# Patient Record
Sex: Male | Born: 1965 | Race: White | Hispanic: No | Marital: Married | Smoking: Former smoker
Health system: Southern US, Community
[De-identification: ages and names within clinical notes are randomized; demographics above are authoritative.]

## PROBLEM LIST (undated history)

## (undated) DIAGNOSIS — F329 Major depressive disorder, single episode, unspecified: Secondary | ICD-10-CM

## (undated) DIAGNOSIS — R109 Unspecified abdominal pain: Secondary | ICD-10-CM

## (undated) DIAGNOSIS — M1990 Unspecified osteoarthritis, unspecified site: Secondary | ICD-10-CM

## (undated) DIAGNOSIS — F4310 Post-traumatic stress disorder, unspecified: Secondary | ICD-10-CM

## (undated) DIAGNOSIS — R000 Tachycardia, unspecified: Secondary | ICD-10-CM

## (undated) DIAGNOSIS — K219 Gastro-esophageal reflux disease without esophagitis: Secondary | ICD-10-CM

## (undated) DIAGNOSIS — F32A Depression, unspecified: Secondary | ICD-10-CM

## (undated) DIAGNOSIS — G4700 Insomnia, unspecified: Secondary | ICD-10-CM

## (undated) DIAGNOSIS — IMO0001 Reserved for inherently not codable concepts without codable children: Secondary | ICD-10-CM

## (undated) DIAGNOSIS — G4730 Sleep apnea, unspecified: Secondary | ICD-10-CM

## (undated) DIAGNOSIS — G8929 Other chronic pain: Secondary | ICD-10-CM

## (undated) DIAGNOSIS — M542 Cervicalgia: Secondary | ICD-10-CM

## (undated) DIAGNOSIS — F419 Anxiety disorder, unspecified: Secondary | ICD-10-CM

## (undated) DIAGNOSIS — I6529 Occlusion and stenosis of unspecified carotid artery: Secondary | ICD-10-CM

## (undated) DIAGNOSIS — F17200 Nicotine dependence, unspecified, uncomplicated: Secondary | ICD-10-CM

## (undated) HISTORY — PX: COLONOSCOPY: SHX174

## (undated) HISTORY — PX: CHOLECYSTECTOMY: SHX55

---

## 2005-10-15 ENCOUNTER — Emergency Department (HOSPITAL_COMMUNITY): Admission: EM | Admit: 2005-10-15 | Discharge: 2005-10-15 | Payer: Self-pay | Admitting: Emergency Medicine

## 2005-10-15 ENCOUNTER — Inpatient Hospital Stay (HOSPITAL_COMMUNITY): Admission: RE | Admit: 2005-10-15 | Discharge: 2005-10-20 | Payer: Self-pay | Admitting: Psychiatry

## 2005-10-16 ENCOUNTER — Ambulatory Visit: Payer: Self-pay | Admitting: Psychiatry

## 2006-04-08 ENCOUNTER — Emergency Department (HOSPITAL_COMMUNITY): Admission: EM | Admit: 2006-04-08 | Discharge: 2006-04-09 | Payer: Self-pay | Admitting: Emergency Medicine

## 2006-04-09 ENCOUNTER — Inpatient Hospital Stay (HOSPITAL_COMMUNITY): Admission: AD | Admit: 2006-04-09 | Discharge: 2006-04-11 | Payer: Self-pay | Admitting: *Deleted

## 2006-04-09 ENCOUNTER — Ambulatory Visit: Payer: Self-pay | Admitting: *Deleted

## 2007-01-16 ENCOUNTER — Emergency Department (HOSPITAL_COMMUNITY): Admission: EM | Admit: 2007-01-16 | Discharge: 2007-01-16 | Payer: Self-pay | Admitting: Emergency Medicine

## 2007-01-17 ENCOUNTER — Ambulatory Visit: Payer: Self-pay | Admitting: *Deleted

## 2007-01-17 ENCOUNTER — Inpatient Hospital Stay (HOSPITAL_COMMUNITY): Admission: AD | Admit: 2007-01-17 | Discharge: 2007-01-19 | Payer: Self-pay | Admitting: *Deleted

## 2007-10-14 ENCOUNTER — Inpatient Hospital Stay (HOSPITAL_COMMUNITY): Admission: AD | Admit: 2007-10-14 | Discharge: 2007-10-16 | Payer: Self-pay | Admitting: Psychiatry

## 2007-10-14 ENCOUNTER — Emergency Department (HOSPITAL_COMMUNITY): Admission: EM | Admit: 2007-10-14 | Discharge: 2007-10-14 | Payer: Self-pay | Admitting: Emergency Medicine

## 2007-10-14 ENCOUNTER — Ambulatory Visit: Payer: Self-pay | Admitting: Psychiatry

## 2008-10-04 ENCOUNTER — Other Ambulatory Visit: Payer: Self-pay | Admitting: Emergency Medicine

## 2008-10-04 ENCOUNTER — Inpatient Hospital Stay (HOSPITAL_COMMUNITY): Admission: RE | Admit: 2008-10-04 | Discharge: 2008-10-07 | Payer: Self-pay | Admitting: Psychiatry

## 2008-10-04 ENCOUNTER — Ambulatory Visit: Payer: Self-pay | Admitting: Psychiatry

## 2009-01-16 ENCOUNTER — Other Ambulatory Visit: Payer: Self-pay | Admitting: Emergency Medicine

## 2009-01-16 ENCOUNTER — Inpatient Hospital Stay (HOSPITAL_COMMUNITY): Admission: AD | Admit: 2009-01-16 | Discharge: 2009-01-19 | Payer: Self-pay | Admitting: Psychiatry

## 2009-01-16 ENCOUNTER — Ambulatory Visit: Payer: Self-pay | Admitting: Psychiatry

## 2009-02-14 ENCOUNTER — Other Ambulatory Visit: Payer: Self-pay

## 2009-02-14 ENCOUNTER — Other Ambulatory Visit: Payer: Self-pay | Admitting: Emergency Medicine

## 2009-02-14 ENCOUNTER — Ambulatory Visit: Payer: Self-pay | Admitting: Psychiatry

## 2009-02-14 ENCOUNTER — Inpatient Hospital Stay (HOSPITAL_COMMUNITY): Admission: AD | Admit: 2009-02-14 | Discharge: 2009-02-16 | Payer: Self-pay | Admitting: Psychiatry

## 2009-04-22 ENCOUNTER — Emergency Department (HOSPITAL_COMMUNITY): Admission: EM | Admit: 2009-04-22 | Discharge: 2009-04-22 | Payer: Self-pay | Admitting: Emergency Medicine

## 2009-04-23 ENCOUNTER — Emergency Department (HOSPITAL_BASED_OUTPATIENT_CLINIC_OR_DEPARTMENT_OTHER): Admission: EM | Admit: 2009-04-23 | Discharge: 2009-04-23 | Payer: Self-pay | Admitting: Emergency Medicine

## 2009-04-25 ENCOUNTER — Emergency Department (HOSPITAL_COMMUNITY): Admission: EM | Admit: 2009-04-25 | Discharge: 2009-04-25 | Payer: Self-pay | Admitting: Emergency Medicine

## 2009-04-29 ENCOUNTER — Emergency Department (HOSPITAL_BASED_OUTPATIENT_CLINIC_OR_DEPARTMENT_OTHER): Admission: EM | Admit: 2009-04-29 | Discharge: 2009-04-29 | Payer: Self-pay | Admitting: Emergency Medicine

## 2009-07-18 ENCOUNTER — Emergency Department (HOSPITAL_COMMUNITY): Admission: EM | Admit: 2009-07-18 | Discharge: 2009-07-18 | Payer: Self-pay | Admitting: Emergency Medicine

## 2009-08-05 ENCOUNTER — Ambulatory Visit: Payer: Self-pay | Admitting: Diagnostic Radiology

## 2009-08-05 ENCOUNTER — Emergency Department (HOSPITAL_BASED_OUTPATIENT_CLINIC_OR_DEPARTMENT_OTHER): Admission: EM | Admit: 2009-08-05 | Discharge: 2009-08-05 | Payer: Self-pay | Admitting: Emergency Medicine

## 2009-08-06 ENCOUNTER — Other Ambulatory Visit: Payer: Self-pay

## 2009-08-06 ENCOUNTER — Other Ambulatory Visit: Payer: Self-pay | Admitting: Emergency Medicine

## 2009-08-06 ENCOUNTER — Inpatient Hospital Stay (HOSPITAL_COMMUNITY): Admission: AD | Admit: 2009-08-06 | Discharge: 2009-08-08 | Payer: Self-pay | Admitting: Psychiatry

## 2009-08-06 ENCOUNTER — Ambulatory Visit: Payer: Self-pay | Admitting: Psychiatry

## 2009-08-25 ENCOUNTER — Emergency Department (HOSPITAL_BASED_OUTPATIENT_CLINIC_OR_DEPARTMENT_OTHER): Admission: EM | Admit: 2009-08-25 | Discharge: 2009-08-25 | Payer: Self-pay | Admitting: Emergency Medicine

## 2009-10-28 ENCOUNTER — Emergency Department (HOSPITAL_BASED_OUTPATIENT_CLINIC_OR_DEPARTMENT_OTHER): Admission: EM | Admit: 2009-10-28 | Discharge: 2009-10-28 | Payer: Self-pay | Admitting: Emergency Medicine

## 2010-01-13 ENCOUNTER — Emergency Department (HOSPITAL_BASED_OUTPATIENT_CLINIC_OR_DEPARTMENT_OTHER): Admission: EM | Admit: 2010-01-13 | Discharge: 2010-01-13 | Payer: Self-pay | Admitting: Emergency Medicine

## 2010-01-13 ENCOUNTER — Ambulatory Visit: Payer: Self-pay | Admitting: Diagnostic Radiology

## 2010-02-16 ENCOUNTER — Emergency Department (HOSPITAL_BASED_OUTPATIENT_CLINIC_OR_DEPARTMENT_OTHER): Admission: EM | Admit: 2010-02-16 | Discharge: 2010-02-16 | Payer: Self-pay | Admitting: Emergency Medicine

## 2010-02-16 ENCOUNTER — Ambulatory Visit: Payer: Self-pay | Admitting: Diagnostic Radiology

## 2010-03-19 ENCOUNTER — Emergency Department (HOSPITAL_COMMUNITY)
Admission: EM | Admit: 2010-03-19 | Discharge: 2010-03-20 | Disposition: A | Discharge status: Other Acute Inpatient Hospital | Payer: Self-pay | Source: Home / Self Care | Admitting: Emergency Medicine

## 2010-03-19 ENCOUNTER — Emergency Department (HOSPITAL_BASED_OUTPATIENT_CLINIC_OR_DEPARTMENT_OTHER)
Admission: EM | Admit: 2010-03-19 | Discharge: 2010-03-19 | Payer: Self-pay | Source: Home / Self Care | Admitting: Emergency Medicine

## 2010-03-20 ENCOUNTER — Inpatient Hospital Stay (HOSPITAL_COMMUNITY)
Admission: RE | Admit: 2010-03-20 | Discharge: 2010-03-22 | Payer: Self-pay | Source: Home / Self Care | Attending: Psychiatry | Admitting: Psychiatry

## 2010-05-05 ENCOUNTER — Emergency Department (HOSPITAL_BASED_OUTPATIENT_CLINIC_OR_DEPARTMENT_OTHER)
Admission: EM | Admit: 2010-05-05 | Discharge: 2010-05-05 | Disposition: A | Discharge status: Home / Self Care | Payer: Medicare Other | Attending: Emergency Medicine | Admitting: Emergency Medicine

## 2010-05-05 DIAGNOSIS — G8929 Other chronic pain: Secondary | ICD-10-CM | POA: Insufficient documentation

## 2010-05-05 DIAGNOSIS — M62838 Other muscle spasm: Secondary | ICD-10-CM | POA: Insufficient documentation

## 2010-05-05 DIAGNOSIS — M542 Cervicalgia: Secondary | ICD-10-CM | POA: Insufficient documentation

## 2010-05-05 DIAGNOSIS — F319 Bipolar disorder, unspecified: Secondary | ICD-10-CM | POA: Insufficient documentation

## 2010-05-24 ENCOUNTER — Emergency Department (HOSPITAL_BASED_OUTPATIENT_CLINIC_OR_DEPARTMENT_OTHER)
Admission: EM | Admit: 2010-05-24 | Discharge: 2010-05-24 | Disposition: A | Discharge status: Home / Self Care | Payer: Medicare Other | Attending: Emergency Medicine | Admitting: Emergency Medicine

## 2010-05-24 DIAGNOSIS — Z79899 Other long term (current) drug therapy: Secondary | ICD-10-CM | POA: Insufficient documentation

## 2010-05-24 DIAGNOSIS — F17200 Nicotine dependence, unspecified, uncomplicated: Secondary | ICD-10-CM | POA: Insufficient documentation

## 2010-05-24 DIAGNOSIS — M542 Cervicalgia: Secondary | ICD-10-CM | POA: Insufficient documentation

## 2010-05-24 DIAGNOSIS — R51 Headache: Secondary | ICD-10-CM | POA: Insufficient documentation

## 2010-05-24 DIAGNOSIS — F3130 Bipolar disorder, current episode depressed, mild or moderate severity, unspecified: Secondary | ICD-10-CM | POA: Insufficient documentation

## 2010-05-24 DIAGNOSIS — F411 Generalized anxiety disorder: Secondary | ICD-10-CM | POA: Insufficient documentation

## 2010-06-08 ENCOUNTER — Emergency Department (HOSPITAL_BASED_OUTPATIENT_CLINIC_OR_DEPARTMENT_OTHER)
Admission: EM | Admit: 2010-06-08 | Discharge: 2010-06-08 | Disposition: A | Discharge status: Home / Self Care | Payer: Medicare Other | Attending: Emergency Medicine | Admitting: Emergency Medicine

## 2010-06-08 DIAGNOSIS — G8929 Other chronic pain: Secondary | ICD-10-CM | POA: Insufficient documentation

## 2010-06-08 DIAGNOSIS — M542 Cervicalgia: Secondary | ICD-10-CM | POA: Insufficient documentation

## 2010-06-08 DIAGNOSIS — Z79899 Other long term (current) drug therapy: Secondary | ICD-10-CM | POA: Insufficient documentation

## 2010-06-08 DIAGNOSIS — F319 Bipolar disorder, unspecified: Secondary | ICD-10-CM | POA: Insufficient documentation

## 2010-06-09 ENCOUNTER — Emergency Department (HOSPITAL_BASED_OUTPATIENT_CLINIC_OR_DEPARTMENT_OTHER)
Admission: EM | Admit: 2010-06-09 | Discharge: 2010-06-09 | Discharge status: Against Medical Advice | Payer: Medicare Other | Attending: Emergency Medicine | Admitting: Emergency Medicine

## 2010-06-09 ENCOUNTER — Ambulatory Visit (HOSPITAL_BASED_OUTPATIENT_CLINIC_OR_DEPARTMENT_OTHER): Payer: No Typology Code available for payment source

## 2010-06-09 ENCOUNTER — Emergency Department (HOSPITAL_BASED_OUTPATIENT_CLINIC_OR_DEPARTMENT_OTHER): Payer: Medicare Other

## 2010-06-09 DIAGNOSIS — Z79899 Other long term (current) drug therapy: Secondary | ICD-10-CM | POA: Insufficient documentation

## 2010-06-09 DIAGNOSIS — G8929 Other chronic pain: Secondary | ICD-10-CM | POA: Insufficient documentation

## 2010-06-09 DIAGNOSIS — F319 Bipolar disorder, unspecified: Secondary | ICD-10-CM | POA: Insufficient documentation

## 2010-06-09 DIAGNOSIS — Y92410 Unspecified street and highway as the place of occurrence of the external cause: Secondary | ICD-10-CM | POA: Insufficient documentation

## 2010-06-09 DIAGNOSIS — M542 Cervicalgia: Secondary | ICD-10-CM | POA: Insufficient documentation

## 2010-06-10 LAB — DIFFERENTIAL
Basophils Absolute: 0 K/uL (ref 0.0–0.1)
Basophils Relative: 0 % (ref 0–1)
Eosinophils Absolute: 0.1 10*3/uL (ref 0.0–0.7)
Eosinophils Relative: 2 % (ref 0–5)
Lymphocytes Relative: 40 % (ref 12–46)
Lymphs Abs: 2.4 10*3/uL (ref 0.7–4.0)
Monocytes Absolute: 0.6 10*3/uL (ref 0.1–1.0)
Monocytes Relative: 10 % (ref 3–12)
Neutro Abs: 2.8 K/uL (ref 1.7–7.7)
Neutrophils Relative %: 47 % (ref 43–77)

## 2010-06-10 LAB — CBC
HCT: 44.3 % (ref 39.0–52.0)
Hemoglobin: 15.7 g/dL (ref 13.0–17.0)
MCH: 34.4 pg — ABNORMAL HIGH (ref 26.0–34.0)
MCHC: 35.4 g/dL (ref 30.0–36.0)
MCV: 97.1 fL (ref 78.0–100.0)
Platelets: 246 K/uL (ref 150–400)
RBC: 4.56 MIL/uL (ref 4.22–5.81)
RDW: 13.6 % (ref 11.5–15.5)
WBC: 6 K/uL (ref 4.0–10.5)

## 2010-06-10 LAB — URINALYSIS, ROUTINE W REFLEX MICROSCOPIC
Glucose, UA: NEGATIVE mg/dL
Ketones, ur: NEGATIVE mg/dL
Leukocytes, UA: NEGATIVE
Nitrite: NEGATIVE
Protein, ur: NEGATIVE mg/dL
Specific Gravity, Urine: 1.036 — ABNORMAL HIGH (ref 1.005–1.030)
Urobilinogen, UA: 0.2 mg/dL (ref 0.0–1.0)
pH: 5.5 (ref 5.0–8.0)

## 2010-06-10 LAB — URINE MICROSCOPIC-ADD ON

## 2010-06-10 LAB — BASIC METABOLIC PANEL
CO2: 24 mEq/L (ref 19–32)
Calcium: 9.2 mg/dL (ref 8.4–10.5)
Chloride: 105 mEq/L (ref 96–112)
GFR calc Af Amer: >60 mL/min (ref >60)
Glucose, Bld: 123 mg/dL — ABNORMAL HIGH (ref 70–99)
Sodium: 138 mEq/L (ref 135–145)

## 2010-06-10 LAB — RAPID URINE DRUG SCREEN, HOSP PERFORMED
Amphetamines: NOT DETECTED
Barbiturates: NOT DETECTED
Benzodiazepines: POSITIVE — AB
Cocaine: NOT DETECTED
Opiates: POSITIVE — AB
Tetrahydrocannabinol: POSITIVE — AB

## 2010-06-10 LAB — BASIC METABOLIC PANEL WITH GFR
BUN: 11 mg/dL (ref 6–23)
Creatinine, Ser: 1.38 mg/dL (ref 0.4–1.5)
GFR calc non Af Amer: 56 mL/min — ABNORMAL LOW (ref >60)
Potassium: 4.4 meq/L (ref 3.5–5.1)

## 2010-06-10 LAB — ETHANOL: Alcohol, Ethyl (B): <5 mg/dL (ref 0–10)

## 2010-06-15 LAB — URINALYSIS, ROUTINE W REFLEX MICROSCOPIC
Bilirubin Urine: NEGATIVE
Glucose, UA: NEGATIVE mg/dL
Hgb urine dipstick: NEGATIVE
Protein, ur: NEGATIVE mg/dL
Urobilinogen, UA: 0.2 mg/dL (ref 0.0–1.0)

## 2010-06-16 LAB — CBC
HCT: 37.3 % — ABNORMAL LOW (ref 39.0–52.0)
HCT: 43.6 % (ref 39.0–52.0)
Hemoglobin: 13.3 g/dL (ref 13.0–17.0)
MCHC: 34.6 g/dL (ref 30.0–36.0)
MCV: 100 fL (ref 78.0–100.0)
MCV: 100.3 fL — ABNORMAL HIGH (ref 78.0–100.0)
Platelets: 185 10*3/uL (ref 150–400)
Platelets: 205 10*3/uL (ref 150–400)
RDW: 12.3 % (ref 11.5–15.5)
RDW: 12.7 % (ref 11.5–15.5)
WBC: 4.3 10*3/uL (ref 4.0–10.5)

## 2010-06-16 LAB — DIFFERENTIAL
Basophils Absolute: 0 10*3/uL (ref 0.0–0.1)
Eosinophils Absolute: 0.1 10*3/uL (ref 0.0–0.7)
Eosinophils Relative: 3 % (ref 0–5)
Lymphocytes Relative: 32 % (ref 12–46)
Lymphs Abs: 1.7 10*3/uL (ref 0.7–4.0)
Monocytes Absolute: 0.3 10*3/uL (ref 0.1–1.0)
Monocytes Absolute: 0.4 10*3/uL (ref 0.1–1.0)
Monocytes Relative: 7 % (ref 3–12)
Monocytes Relative: 9 % (ref 3–12)
Neutro Abs: 2.7 10*3/uL (ref 1.7–7.7)
Neutrophils Relative %: 55 % (ref 43–77)

## 2010-06-16 LAB — COMPREHENSIVE METABOLIC PANEL
Albumin: 3.6 g/dL (ref 3.5–5.2)
BUN: 11 mg/dL (ref 6–23)
BUN: 11 mg/dL (ref 6–23)
Chloride: 106 mEq/L (ref 96–112)
Creatinine, Ser: 1.03 mg/dL (ref 0.4–1.5)
Creatinine, Ser: 1.03 mg/dL (ref 0.4–1.5)
GFR calc non Af Amer: >60 mL/min (ref >60)
Glucose, Bld: 100 mg/dL — ABNORMAL HIGH (ref 70–99)
Glucose, Bld: 86 mg/dL (ref 70–99)
Potassium: 3.9 mEq/L (ref 3.5–5.1)
Total Bilirubin: 0.5 mg/dL (ref 0.3–1.2)
Total Protein: 6.3 g/dL (ref 6.0–8.3)

## 2010-06-16 LAB — URINALYSIS, ROUTINE W REFLEX MICROSCOPIC
Glucose, UA: NEGATIVE mg/dL
Hgb urine dipstick: NEGATIVE
Ketones, ur: NEGATIVE mg/dL
Protein, ur: NEGATIVE mg/dL
pH: 6 (ref 5.0–8.0)

## 2010-06-17 LAB — COMPREHENSIVE METABOLIC PANEL
ALT: 22 U/L (ref 0–53)
AST: 24 U/L (ref 0–37)
Albumin: 3.9 g/dL (ref 3.5–5.2)
Alkaline Phosphatase: 63 U/L (ref 39–117)
BUN: 12 mg/dL (ref 6–23)
Chloride: 107 mEq/L (ref 96–112)
GFR calc Af Amer: >60 mL/min (ref >60)
Potassium: 4.4 mEq/L (ref 3.5–5.1)
Sodium: 144 mEq/L (ref 135–145)
Total Protein: 7.1 g/dL (ref 6.0–8.3)

## 2010-06-17 LAB — CBC
Platelets: 200 10*3/uL (ref 150–400)
RDW: 13.1 % (ref 11.5–15.5)
WBC: 4.3 10*3/uL (ref 4.0–10.5)

## 2010-06-17 LAB — URINALYSIS, ROUTINE W REFLEX MICROSCOPIC
Ketones, ur: NEGATIVE mg/dL
Nitrite: NEGATIVE
Protein, ur: NEGATIVE mg/dL
Urobilinogen, UA: 0.2 mg/dL (ref 0.0–1.0)

## 2010-06-17 LAB — DIFFERENTIAL
Basophils Relative: 1 % (ref 0–1)
Eosinophils Relative: 2 % (ref 0–5)
Monocytes Absolute: 0.3 10*3/uL (ref 0.1–1.0)
Monocytes Relative: 8 % (ref 3–12)
Neutro Abs: 2.8 10*3/uL (ref 1.7–7.7)

## 2010-06-18 LAB — CBC
HCT: 39.7 % (ref 39.0–52.0)
Hemoglobin: 14.2 g/dL (ref 13.0–17.0)
Hemoglobin: 14.1 g/dL (ref 13.0–17.0)
MCHC: 35.5 g/dL (ref 30.0–36.0)
MCHC: 34.8 g/dL (ref 30.0–36.0)
MCV: 97.8 fL (ref 78.0–100.0)
MCV: 97.2 fL (ref 78.0–100.0)
RBC: 3.95 MIL/uL — ABNORMAL LOW (ref 4.22–5.81)
RBC: 4.21 MIL/uL — ABNORMAL LOW (ref 4.22–5.81)
RDW: 13.7 % (ref 11.5–15.5)
WBC: 4.5 10*3/uL (ref 4.0–10.5)

## 2010-06-18 LAB — DIFFERENTIAL
Basophils Absolute: 0 10*3/uL (ref 0.0–0.1)
Basophils Absolute: 0 10*3/uL (ref 0.0–0.1)
Basophils Relative: 1 % (ref 0–1)
Basophils Relative: 1 % (ref 0–1)
Eosinophils Absolute: 0.1 10*3/uL (ref 0.0–0.7)
Eosinophils Absolute: 0.1 10*3/uL (ref 0.0–0.7)
Lymphocytes Relative: 35 % (ref 12–46)
Lymphs Abs: 1.6 10*3/uL (ref 0.7–4.0)
Lymphs Abs: 2 10*3/uL (ref 0.7–4.0)
Monocytes Absolute: 0.5 10*3/uL (ref 0.1–1.0)
Neutro Abs: 2.1 10*3/uL (ref 1.7–7.7)
Neutrophils Relative %: 53 % (ref 43–77)
Neutrophils Relative %: 56 % (ref 43–77)
Neutrophils Relative %: 46 % (ref 43–77)

## 2010-06-18 LAB — COMPREHENSIVE METABOLIC PANEL
ALT: 133 U/L — ABNORMAL HIGH (ref 0–53)
ALT: 58 U/L — ABNORMAL HIGH (ref 0–53)
AST: 49 U/L — ABNORMAL HIGH (ref 0–37)
Albumin: 3.5 g/dL (ref 3.5–5.2)
Alkaline Phosphatase: 83 U/L (ref 39–117)
BUN: 9 mg/dL (ref 6–23)
CO2: 25 mEq/L (ref 19–32)
CO2: 28 mEq/L (ref 19–32)
Calcium: 8.8 mg/dL (ref 8.4–10.5)
Calcium: 9.3 mg/dL (ref 8.4–10.5)
Chloride: 105 mEq/L (ref 96–112)
Creatinine, Ser: 1.02 mg/dL (ref 0.4–1.5)
GFR calc Af Amer: >60 mL/min (ref >60)
GFR calc non Af Amer: >60 mL/min (ref >60)
GFR calc non Af Amer: >60 mL/min (ref >60)
Glucose, Bld: 94 mg/dL (ref 70–99)
Glucose, Bld: 95 mg/dL (ref 70–99)
Glucose, Bld: 93 mg/dL (ref 70–99)
Potassium: 3.7 mEq/L (ref 3.5–5.1)
Sodium: 144 mEq/L (ref 135–145)
Sodium: 138 mEq/L (ref 135–145)
Total Bilirubin: 0.4 mg/dL (ref 0.3–1.2)
Total Bilirubin: 0.2 mg/dL — ABNORMAL LOW (ref 0.3–1.2)

## 2010-06-18 LAB — URINALYSIS, ROUTINE W REFLEX MICROSCOPIC
Bilirubin Urine: NEGATIVE
Hgb urine dipstick: NEGATIVE
Ketones, ur: NEGATIVE mg/dL
Nitrite: NEGATIVE
Nitrite: NEGATIVE
Protein, ur: NEGATIVE mg/dL
Specific Gravity, Urine: 1.012 (ref 1.005–1.030)
Urobilinogen, UA: 0.2 mg/dL (ref 0.0–1.0)
pH: 5 (ref 5.0–8.0)
pH: 6 (ref 5.0–8.0)

## 2010-06-18 LAB — RAPID URINE DRUG SCREEN, HOSP PERFORMED
Barbiturates: NOT DETECTED
Cocaine: NOT DETECTED
Opiates: POSITIVE — AB

## 2010-06-18 LAB — LIPASE, BLOOD
Lipase: 38 U/L (ref 11–59)
Lipase: 185 U/L (ref 23–300)

## 2010-06-18 LAB — ACETAMINOPHEN LEVEL: Acetaminophen (Tylenol), Serum: <10 ug/mL — ABNORMAL LOW (ref 10–30)

## 2010-06-18 LAB — ETHANOL: Alcohol, Ethyl (B): <5 mg/dL (ref 0–10)

## 2010-07-03 LAB — DIFFERENTIAL
Basophils Absolute: 0.1 10*3/uL (ref 0.0–0.1)
Eosinophils Relative: 2 % (ref 0–5)
Lymphocytes Relative: 32 % (ref 12–46)
Lymphs Abs: 2.3 10*3/uL (ref 0.7–4.0)
Monocytes Absolute: 0.6 10*3/uL (ref 0.1–1.0)
Monocytes Relative: 8 % (ref 3–12)
Neutro Abs: 4 10*3/uL (ref 1.7–7.7)

## 2010-07-03 LAB — RAPID URINE DRUG SCREEN, HOSP PERFORMED
Cocaine: NOT DETECTED
Tetrahydrocannabinol: NOT DETECTED

## 2010-07-03 LAB — BASIC METABOLIC PANEL
BUN: 7 mg/dL (ref 6–23)
CO2: 28 mEq/L (ref 19–32)
Chloride: 104 mEq/L (ref 96–112)
GFR calc non Af Amer: >60 mL/min (ref >60)
Glucose, Bld: 109 mg/dL — ABNORMAL HIGH (ref 70–99)
Potassium: 3.9 mEq/L (ref 3.5–5.1)
Sodium: 137 mEq/L (ref 135–145)

## 2010-07-03 LAB — CBC
HCT: 46.7 % (ref 39.0–52.0)
Hemoglobin: 16.4 g/dL (ref 13.0–17.0)
RBC: 4.58 MIL/uL (ref 4.22–5.81)
RDW: 13.6 % (ref 11.5–15.5)

## 2010-07-03 LAB — TRICYCLICS SCREEN, URINE: TCA Scrn: NOT DETECTED

## 2010-07-04 LAB — CBC
HCT: 41.7 % (ref 39.0–52.0)
Hemoglobin: 14.7 g/dL (ref 13.0–17.0)
MCV: 102.8 fL — ABNORMAL HIGH (ref 78.0–100.0)
Platelets: 209 10*3/uL (ref 150–400)
RDW: 14.7 % (ref 11.5–15.5)

## 2010-07-04 LAB — BASIC METABOLIC PANEL
BUN: 14 mg/dL (ref 6–23)
CO2: 28 mEq/L (ref 19–32)
Chloride: 104 mEq/L (ref 96–112)
GFR calc non Af Amer: 57 mL/min — ABNORMAL LOW (ref >60)
Glucose, Bld: 95 mg/dL (ref 70–99)
Potassium: 4 mEq/L (ref 3.5–5.1)
Sodium: 139 mEq/L (ref 135–145)

## 2010-07-04 LAB — DIFFERENTIAL
Basophils Absolute: 0 10*3/uL (ref 0.0–0.1)
Eosinophils Absolute: 0.1 10*3/uL (ref 0.0–0.7)
Eosinophils Relative: 2 % (ref 0–5)
Lymphocytes Relative: 33 % (ref 12–46)
Lymphs Abs: 1.4 10*3/uL (ref 0.7–4.0)
Monocytes Absolute: 0.4 10*3/uL (ref 0.1–1.0)

## 2010-07-04 LAB — TRICYCLICS SCREEN, URINE: TCA Scrn: NOT DETECTED

## 2010-07-04 LAB — RAPID URINE DRUG SCREEN, HOSP PERFORMED
Amphetamines: NOT DETECTED
Benzodiazepines: POSITIVE — AB

## 2010-07-07 LAB — COMPREHENSIVE METABOLIC PANEL
ALT: 20 U/L (ref 0–53)
AST: 22 U/L (ref 0–37)
Albumin: 3.6 g/dL (ref 3.5–5.2)
Alkaline Phosphatase: 58 U/L (ref 39–117)
Chloride: 109 mEq/L (ref 96–112)
Potassium: 4 mEq/L (ref 3.5–5.1)
Sodium: 142 mEq/L (ref 135–145)
Total Protein: 6.1 g/dL (ref 6.0–8.3)

## 2010-07-07 LAB — ETHANOL: Alcohol, Ethyl (B): <5 mg/dL (ref 0–10)

## 2010-07-07 LAB — DIFFERENTIAL
Basophils Relative: 1 % (ref 0–1)
Eosinophils Absolute: 0.1 10*3/uL (ref 0.0–0.7)
Eosinophils Relative: 1 % (ref 0–5)
Monocytes Relative: 9 % (ref 3–12)
Neutrophils Relative %: 59 % (ref 43–77)

## 2010-07-07 LAB — URINALYSIS, ROUTINE W REFLEX MICROSCOPIC
Glucose, UA: NEGATIVE mg/dL
Hgb urine dipstick: NEGATIVE
Ketones, ur: NEGATIVE mg/dL
Protein, ur: NEGATIVE mg/dL
Urobilinogen, UA: 0.2 mg/dL (ref 0.0–1.0)

## 2010-07-07 LAB — CBC
Platelets: 208 10*3/uL (ref 150–400)
RDW: 14.4 % (ref 11.5–15.5)
WBC: 4.7 10*3/uL (ref 4.0–10.5)

## 2010-07-07 LAB — RAPID URINE DRUG SCREEN, HOSP PERFORMED
Amphetamines: NOT DETECTED
Barbiturates: NOT DETECTED
Benzodiazepines: POSITIVE — AB
Tetrahydrocannabinol: POSITIVE — AB
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
## 2010-08-13 NOTE — H&P (Signed)
NAMEJERIMY, [name]               ACCOUNT NO.:  [id number]   MEDICAL RECORD NO.:  [id number]          PATIENT TYPE:  IPS   LOCATION:  0406                          FACILITY:  [facility]   PHYSICIAN:  Anselm Jungling, MD  DATE OF BIRTH:  Aug 22, 1965   DATE OF ADMISSION:  10/14/2007  DATE OF DISCHARGE:                       PSYCHIATRIC ADMISSION ASSESSMENT   VOLUNTARY ADMISSION:  This is a 45 year old married white male.  He  presented to the emergency department at [facility].  He reported that  he had become suicidal with a plan to kill himself.  This plan was to  use a horse needle to drain his blood.  He reported this plan to his  counselor, Sherron Monday, who advised admission.  The patient reported that  he had been noncompliant with his prescribed meds recently, due to  financial difficulties.  Today, he clarifies this by saying that  actually specifically it was Geodon.  His urine drug screen was positive  for cocaine, marijuana, benzos and amphetamines.  He is prescribed the  benzo.  He had no explanation for the amphetamines.  He states that,  because he started having suicidal ideation, he went and used cocaine.  He states that this is only his second use of cocaine since he was last  treated at [facility] in April.   PAST PSYCHIATRIC HISTORY:  [name] was with [place] last October.  At that  time, he was only positive for marijuana.  He was treated at the [facility] for approximately ten days.  He reports not staying  clean any length of time at all.  He currently sees a therapist, Sherron Monday.   SOCIAL HISTORY:  He is married for 19 years.  He has no children.  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   FAMILY HISTORY:  He states his grandfather used alcohol.  His maternal  uncle was bipolar.  He is since deceased, and his mother has depression.   ALCOHOL AND DRUG HISTORY:  He began using marijuana and alcohol at age  36.  He started  using cocaine at age 49.   His primary care physician is Dr. Kathrynn Speed in [facility].  He has no  psychiatrist and he does see the therapist, Sherron Monday.   MEDICAL PROBLEMS:  None are known.   MEDICATIONS:  1. He is prescribed Paxil 80 mg p.o. daily.  2. Geodon 60 mg p.o. b.i.d.  3. Ambien 10 mg at h.s.  4. Klonopin 1 mg at h.s.  5. Xanax 0.5 mg p.o. b.i.d.  6. Nexium 40 mg p.o. daily.   ALLERGIES:  He states that he has an allergy to ANTIHISTAMINES; they  make his head crawl.   POSITIVE PHYSICAL FINDINGS:  As already stated, his UDS was markedly  positive, cocaine, marijuana, benzos, amphetamines.  His alcohol level  was less than 5.  His glucose level was 118.  He had no other abnormal  lab findings.  He was medically cleared in the ED at [facility] and his  physical exam on arrival in our unit shows that he is 5 feet 8, he  weighs 186, his temperature is 98.6, blood pressure is 108/76 to 107/73,  pulse is 87, respirations are 20.   MENTAL STATUS EXAM:  Today he is alert and oriented.  He is casually  dressed in a T-shirt and jeans.  He is appropriately groomed, having  just showered, and he appears to be adequately nourished.  His speech is  soft and slow.  His mood is better after having gotten some sleep last  night.  His affect looks kind of sleepy.  His thought processes are  clear, rational and goal-oriented.  He states that the Geodon co-pay is  too expensive for him.  Judgment and insight are intact.  Concentration  and memory are superficially intact.  He was not thoroughly tested.  His  intelligence level is average to borderline.  He denies being actively  suicidal.  He was never homicidal and he has never had auditory or  visual hallucinations.   DIAGNOSIS:  AXIS I:  Substance-induced mood disorder, polysubstance  abuse, depressive disorder, NOS.  AXIS II:  Borderline IQ possibility.  AXIS III:  GERD.  AXIS IV:  Financial.  AXIS V:  25.   PLAN:  The plan is to  admit for safety and stabilization.  We will  restart his medications that have been efficacious.  We will have the  pharmacist work with him regarding co-pay and we may have to substitute  a medication, if he really cannot afford this.  Estimated length of stay  is 3-5 days.      Mickie Leonarda Salon, P.A.-C.      Anselm Jungling, MD  Electronically Signed    MD/MEDQ  D:  10/16/2007  T:  10/16/2007  Job:  [phone]
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
## 2010-08-16 NOTE — Discharge Summary (Signed)
NAMECHIEF, WALKUP               ACCOUNT NO.:  [id number]   MEDICAL RECORD NO.:  [id number]          PATIENT TYPE:  IPS   LOCATION:  0303                          FACILITY:  BH   PHYSICIAN:  Jasmine Pang, M.D. DATE OF BIRTH:  05-Aug-1965   DATE OF ADMISSION:  10/04/2008  DATE OF DISCHARGE:  10/07/2008                               DISCHARGE SUMMARY   IDENTIFICATION:  This is a 45 year old married white male from [facility] who was admitted on a voluntary basis.   HISTORY OF PRESENT ILLNESS:  The patient complained of anxiety with  thoughts of hurting himself.  He presented to the ED with complaints of  anxiety.  He reported this started when a friend died 2 days ago.  He  reports he is worried he may harm himself.  He admitted to being very  upset about the recent death of his friend.  He is married and has a  supportive wife.  He is on disability for psychiatric problems.  He is  currently on Geodon and Paxil.  He was here approximately 2 years ago.  He feels his medicines do good.  For further admission information,  see psychiatric admission assessment.   PHYSICAL FINDINGS:  There were no acute physical or medical problems  noted.   HOSPITAL COURSE:  Upon admission, the patient was restarted on his home  medications of Ambien 10 mg p.o. q.h.s. Klonopin 1 mg q.a.m. and h.s.,  Ativan 1 mg daily p.r.n., Paxil 40 mg daily, Geodon 40 mg a.m. and 1700  hours, Nexium 40 mg daily, and albuterol inhaler 90 mcg q.4-6 h. p.r.n.  He was also started on Cogentin 1 mg p.o. b.i.d. to be given with the  Geodon and ibuprofen 600 mg p.o. q.4 h p.r.n. pain.  In individual  sessions, the patient had good eye contact.  Speech was normal rate and  flow.  Psychomotor activity was within normal limits.  Mood was  depressed and anxious.  Affect was consistent with mood.  There was  positive suicidal ideation.  No homicidal ideation.  There was no  evidence of a thought disorder or  psychosis.  On October 06, 2008, the  patient become less depressed and less anxious.  His Ativan was  discontinued.  He was changed to Klonopin 1 mg p.o. b.i.d. or a.m. and 1  p.m. and 1 mg at bedtime.  On October 07, 2008, he stated he felt less  jumpy on Cogentin.  His sleep was good.  Appetite was good.  Mood was  euthymic.  Affect consistent with mood.  There was no suicidal or  homicidal ideation.  No thoughts of self injurious behavior.  No  auditory or visual hallucinations.  No paranoia or delusions.  Thoughts  were logical and goal-directed.  Thought content no predominant theme.  Cognitive was grossly intact.  Insight good.  Judgment good.  Impulse  control good.  It was felt the patient was safe for discharge today.   DISCHARGE DIAGNOSES:  Axis I:  Bipolar disorder, not otherwise  specified.  Axis II:  No diagnosis.  Axis III:  Gastroesophageal reflux disease.  Axis IV:  Severe (grief with death of friend, problems related to social  environment, and burden of psychiatric illness).  Axis V:  Global assessment of functioning was 50 upon discharge.  Global  assessment of functioning was 30 upon admission.  Global assessment of  functioning highest past year was 65.   DISCHARGE PLAN:  There are no specific activity level or dietary  restrictions.   POST HOSPITAL CARE PLANS:  The patient will see his primary care doctor,  Dr. Brock Bad on July 12 at 10 o'clock.  He will also see his counselor,  Sherron Monday for followup therapy.   DISCHARGE MEDICATIONS:  1. Paxil 40 mg daily.  2. Geodon 40 mg twice daily with food.  3. Nexium 40 mg daily.  4. Cogentin 1 mg twice daily.  5. Klonopin 1 mg 3 times daily.  6. Ambien 10 mg at bedtime.      Jasmine Pang, M.D.  Electronically Signed     BHS/MEDQ  D:  10/16/2008  T:  10/17/2008  Job:  [id number]
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
## 2010-08-16 NOTE — Discharge Summary (Signed)
[name], [name]               ACCOUNT NO.:  [id number]   MEDICAL RECORD NO.:  [id number]          PATIENT TYPE:  IPS   LOCATION:  0406                          FACILITY:  [facility]   PHYSICIAN:  Jasmine Pang, M.D. DATE OF BIRTH:  29-Nov-1965   DATE OF ADMISSION:  04/09/2006  DATE OF DISCHARGE:  04/11/2006                               DISCHARGE SUMMARY   IDENTIFICATION:  A  45 year old married white male who was admitted on a  voluntary basis on April 09, 2006.  The patient had a history of  depression with suicidal ideation, stating he had a plan to get the  blood out.  He has been off his meds for one week.  He states he could  not afford them.  He is unemployed.  He has been unemployed for some  time.  He has been getting nervous, knowing disability is coming.  He  denies any voices.  He states he rushed to his therapist, who felt he  should come into the hospital.  This is the second psych admission for  this patient.  He was admitted in the 1990s at some point.  His  therapist as [name].  He is currently on Paxil 80 mg daily, Geodon 400  mg daily, Xanax 0.5 mg b.i.d. p.r.n., Klonopin 1 mg q.h.s., Ambien 10 mg  q.h.s. p.r.n., Trazodone 300 mg q.h.s.   PHYSICAL EXAMINATION:  The patient was a middle-aged male in bed in no  acute physical distress.  His physical exam was unremarkable.  It was  done in the [facility] ED.   Admission laboratories were done in the ED.  Hemoglobin was 16,  hematocrit 47.  Sodium was 134.  UDS was positive for amphetamines,  benzodiazepines and THC.  Alcohol level was less than 5.  The other labs  were reviewed by the ED physician.   HOSPITAL COURSE:  Upon admission the patient was placed on Paxil 80 mg  daily, Geodon 40 mg daily, Ambien 10 mg p.o. q.h.s. p.r.n. insomnia,  Xanax 0.5 mg p.o. b.i.d., Klonopin 1 mg p.o. q.h.s. and trazodone 300 mg  p.o. q.h.s. p.r.n. insomnia.  On April 10, 2006, the patient's Ambien  was increased to 20 mg  p.o. q.h.s. as a standing order.  The patient  tolerated his medications well.  On April 09, 2006, he stated he had  become increasingly depressed after stopping his medicines, he was not  sleeping well.  He admitted to drinking some, and using a small amount  of marijuana around Christmas, but denied this was of significant  proportions.  On April 10, 2006, Ambien was increased due to insomnia.  He was still anxious, but there was no suicidal or homicidal ideation.  Appetite was good.  The patient talked about seeing [name], his  therapist, on a regular basis.  He denies regular use of drugs or  alcohol.   On April 11, 2006, the patient was requesting to go home.  His father  was in the hospital with blood clots, and he wanted to be with him.  He  was feeling  better.  He denied suicidal or homicidal ideation.  No  psychosis.  Thoughts were logical and goal-directed.  Thought content  revolved around wanting to be with his father.  He stated he was feeling  fairly well since he has been back on his medications and he is sleeping  better.  The information about his father was substantiated with the  patient's wife, who agreed that the patient should be discharged to be  with his father.   DISCHARGE DIAGNOSES:  AXIS I: Bipolar disorder, depressed mood, severe,  without psychosis.  Also cannabis abuse.  AXIS II: None.  AXIS III: No acute or chronic health problems.  AXIS IV: Severe (occupational problem, economic problem, other  psychosocial problems, burden of illness, problems with primary support  group; father's illness, medical problems).  AXIS V: GAF upon discharge was 50.  GAF upon admission was 35.  GAF  highest in the past year was 65.   DISCHARGE/PLAN:  There were no specific activity level or dietary  restrictions.  The patient was discharged on Paxil 80 mg daily, Geodon  40 mg with meals daily, Klonopin 1 mg at bedtime, Xanax 0.5 mg p.o.  b.i.d.   POST HOSPITAL  CARE PLANS:  The patient will be seen by [name] on  Wednesday, April 22, 2006, at 10:00 a.m. and by Dr. Tomasa Orozco on  Thursday, May 07, 2006, at 1:00 p.m.      Jasmine Pang, M.D.  Electronically Signed     BHS/MEDQ  D:  04/23/2006  T:  04/23/2006  Job:  [id number]
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
## 2010-12-27 LAB — RAPID URINE DRUG SCREEN, HOSP PERFORMED
Barbiturates: NOT DETECTED
Cocaine: POSITIVE — AB
Opiates: NOT DETECTED

## 2010-12-27 LAB — POCT I-STAT, CHEM 8
BUN: 6
Calcium, Ion: 1.18
Chloride: 104
Creatinine, Ser: 1.3
Glucose, Bld: 118 — ABNORMAL HIGH
Potassium: 4.1

## 2011-01-08 LAB — URINALYSIS, ROUTINE W REFLEX MICROSCOPIC
Glucose, UA: NEGATIVE
Hgb urine dipstick: NEGATIVE
Ketones, ur: NEGATIVE
Protein, ur: NEGATIVE
Urobilinogen, UA: 0.2

## 2011-01-08 LAB — TSH: TSH: 0.861

## 2011-01-08 LAB — D-DIMER, QUANTITATIVE: D-Dimer, Quant: <0.22

## 2011-01-08 LAB — RAPID URINE DRUG SCREEN, HOSP PERFORMED
Amphetamines: NOT DETECTED
Benzodiazepines: POSITIVE — AB

## 2011-01-08 LAB — I-STAT 8, (EC8 V) (CONVERTED LAB)
Bicarbonate: 24.4 — ABNORMAL HIGH
HCT: 45
Hemoglobin: 15.3
Operator id: 294521
Sodium: 139
TCO2: 26

## 2011-10-23 ENCOUNTER — Encounter (HOSPITAL_COMMUNITY): Payer: Self-pay | Admitting: *Deleted

## 2011-10-23 ENCOUNTER — Emergency Department (HOSPITAL_COMMUNITY)
Admission: EM | Admit: 2011-10-23 | Discharge: 2011-10-24 | Disposition: A | Discharge status: Home / Self Care | Payer: Medicare Other | Attending: Emergency Medicine | Admitting: Emergency Medicine

## 2011-10-23 DIAGNOSIS — IMO0002 Reserved for concepts with insufficient information to code with codable children: Secondary | ICD-10-CM | POA: Insufficient documentation

## 2011-10-23 LAB — POCT I-STAT, CHEM 8
BUN: 8 mg/dL (ref 6–23)
Calcium, Ion: 1.25 mmol/L — ABNORMAL HIGH (ref 1.12–1.23)
Chloride: 103 meq/L (ref 96–112)
Creatinine, Ser: 1.3 mg/dL (ref 0.50–1.35)
Glucose, Bld: 97 mg/dL (ref 70–99)
HCT: 44 % (ref 39.0–52.0)
Hemoglobin: 15 g/dL (ref 13.0–17.0)
Potassium: 4.7 mEq/L (ref 3.5–5.1)
Sodium: 137 meq/L (ref 135–145)
TCO2: 26 mmol/L (ref 0–100)

## 2011-10-23 LAB — CBC WITH DIFFERENTIAL/PLATELET
Eosinophils Absolute: 0.1 10*3/uL (ref 0.0–0.7)
Eosinophils Relative: 3 % (ref 0–5)
HCT: 41.4 % (ref 39.0–52.0)
Hemoglobin: 14.3 g/dL (ref 13.0–17.0)
Lymphs Abs: 2.5 10*3/uL (ref 0.7–4.0)
MCH: 34 pg (ref 26.0–34.0)
MCV: 98.6 fL (ref 78.0–100.0)
Monocytes Relative: 7 % (ref 3–12)
Neutrophils Relative %: 42 % — ABNORMAL LOW (ref 43–77)
RBC: 4.2 MIL/uL — ABNORMAL LOW (ref 4.22–5.81)

## 2011-10-23 MED ORDER — BUPIVACAINE HCL 0.5 % IJ SOLN
50.0000 mL | Freq: Once | INTRAMUSCULAR | Status: DC
  Filled 2011-10-23: qty 50

## 2011-10-23 MED ORDER — DOXYCYCLINE HYCLATE 100 MG IV SOLR
100.0000 mg | Freq: Once | INTRAVENOUS | Status: AC
  Administered 2011-10-24: 100 mg via INTRAVENOUS
  Filled 2011-10-23 (×2): qty 100

## 2011-10-23 MED ORDER — ONDANSETRON HCL 4 MG/2ML IJ SOLN
4.0000 mg | Freq: Once | INTRAMUSCULAR | Status: AC
  Administered 2011-10-23: 4 mg via INTRAVENOUS
  Filled 2011-10-23: qty 2

## 2011-10-23 MED ORDER — HYDROMORPHONE HCL PF 1 MG/ML IJ SOLN
1.0000 mg | Freq: Once | INTRAMUSCULAR | Status: AC
  Administered 2011-10-23: 1 mg via INTRAVENOUS
  Filled 2011-10-23: qty 1

## 2011-10-23 MED ORDER — SODIUM CHLORIDE 0.9 % IV SOLN
Freq: Once | INTRAVENOUS | Status: AC
  Administered 2011-10-23: 22:00:00 via INTRAVENOUS

## 2011-10-23 NOTE — ED Notes (Signed)
Awaiting antibiotic from pharmacy.

## 2011-10-23 NOTE — ED Notes (Signed)
Patient with left index finger infection.  Patient had history of same and states that it was treated on 7/21.  Patient states that his left finger is very painful and hurts.

## 2011-10-23 NOTE — ED Notes (Signed)
PT reports his finger has been drained  2 times and he is currently taking PO anti-BX. Pt and family report his finger looks worse today.

## 2011-10-23 NOTE — ED Provider Notes (Signed)
History     CSN: [id number]  Arrival date & time 10/23/11  1914   First MD Initiated Contact with Patient 10/23/11 2122      Chief Complaint  Patient presents with  . Finger Injury    (Consider location/radiation/quality/duration/timing/severity/associated sxs/prior treatment) HPI Comments: Patient with an infection of the distal tip of the left index, finger.  That was seen at [facility] on Sunday.  He had an I&D performed.  There he followup with his primary care physician on Tuesday, you again performed.  A second I&D.  Patient informs me that he has more pain, swelling, has been no drainage from the site.  He now has generalized myalgias, and pain.  That extends to the MCP joint with minimal swelling  The history is provided by the patient.    History reviewed. No pertinent past medical history.  History reviewed. No pertinent past surgical history.  History reviewed. No pertinent family history.  History  Substance Use Topics  . Smoking status: Former Games developer  . Smokeless tobacco: Not on file  . Alcohol Use: Yes      Review of Systems  Constitutional: Positive for chills. Negative for fever.  Musculoskeletal: Positive for myalgias and joint swelling.  Neurological: Negative for dizziness, weakness and numbness.    Allergies  Antihistamines, chlorpheniramine-type  Home Medications   Current Outpatient Rx  Name Route Sig Dispense Refill  . ALPRAZOLAM 1 MG PO TABS Oral Take 1 mg by mouth 3 (three) times daily as needed. For anxiety    . CLONAZEPAM 1 MG PO TABS Oral Take 1 mg by mouth at bedtime.    Marland Kitchen ESOMEPRAZOLE MAGNESIUM 40 MG PO CPDR Oral Take 40 mg by mouth daily before breakfast.    . OXYCODONE-ACETAMINOPHEN 10-325 MG PO TABS Oral Take 0.5-1 tablets by mouth every 8 (eight) hours as needed. For pain    . OXYCODONE-ACETAMINOPHEN 10-650 MG PO TABS Oral Take 1 tablet by mouth every 6 (six) hours as needed. For neck pain    . QUETIAPINE FUMARATE ER 200 MG  PO TB24 Oral Take 200 mg by mouth at bedtime.    . SULFAMETHOXAZOLE-TMP DS 800-160 MG PO TABS Oral Take 1 tablet by mouth 2 (two) times daily.    . VENLAFAXINE HCL ER 150 MG PO CP24 Oral Take 150 mg by mouth every morning.    Marland Kitchen ZOLPIDEM TARTRATE ER 12.5 MG PO TBCR Oral Take 12.5 mg by mouth at bedtime.      BP 139/89  Pulse 115  Temp 98.2 F (36.8 C) (Oral)  Resp 20  SpO2 95%  Physical Exam  Constitutional: He is oriented to person, place, and time. He appears well-developed.  HENT:  Head: Normocephalic.  Eyes: Pupils are equal, round, and reactive to light.  Neck: Normal range of motion.  Cardiovascular: Normal rate.   Pulmonary/Chest: Effort normal.  Musculoskeletal: He exhibits tenderness. He exhibits no edema.  Neurological: He is alert and oriented to person, place, and time.  Skin: Skin is warm. There is erythema.       As the tip of the left index, finger , red, swollen, tender, with visible purulent pockets nail is discolored    ED Course  Procedures (including critical care time)  Labs Reviewed  CBC WITH DIFFERENTIAL - Abnormal; Notable for the following:    RBC 4.20 (*)     Neutrophils Relative 42 (*)     Lymphocytes Relative 48 (*)     All other components within normal  limits  POCT I-STAT, CHEM 8 - Abnormal; Notable for the following:    Calcium, Ion 1.25 (*)     All other components within normal limits   No results found.   1. Felon       MDM  I've asked Dr. Oletta Lamas, to examine the patient's finger, and he agrees that hand surgery should be called Discussed with Dr. Magnus Ivan who came to the bedside and performed an I&D of this patient's Dilantin.  He did prescribe and write for Percocet, doxycycline, and soaks, with office followup.  Patient was instructed by Dr. Magnus Ivan of all of these instructions        Arman Filter, NP 10/24/11 0130

## 2011-10-24 MED ORDER — ALPRAZOLAM 0.25 MG PO TABS
1.0000 mg | ORAL_TABLET | Freq: Once | ORAL | Status: AC
  Administered 2011-10-24: 1 mg via ORAL
  Filled 2011-10-24: qty 4

## 2011-10-24 NOTE — Op Note (Signed)
[name], [name]               ACCOUNT NO.:  [id number]  MEDICAL RECORD NO.:  [id number]  LOCATION:  TR05C                        FACILITY:  [facility]  PHYSICIAN:  Vanita Panda. Magnus Ivan, M.D.DATE OF BIRTH:  May 13, 1965  DATE OF PROCEDURE:  10/23/2011 DATE OF DISCHARGE:                              OPERATIVE REPORT   PREPROCEDURE DIAGNOSIS:  Left index finger felon.  POSTPROCEDURE DIAGNOSIS:  Left index finger felon.  PROCEDURE:  Irrigation and debridement of left index finger felon.  SURGEON:  Vanita Panda. Magnus Ivan, MD  ANESTHESIA: 1. 0.25% plain Sensorcaine digital block of left index finger. 2. 1 g of IV Dilaudid.  BLOOD LOSS:  Minimal.  COMPLICATIONS:  None.  INDICATIONS:  [name] is a 46 year old gentleman with a left index finger felon.  He was seen by his primary care physician and by [facility] on two separate days, which they "lanced the infection."  He was started on Bactrim and after continued pain, he came to the [facility] today.  He is someone who is already on chronic pain medicine, which is Percocet 10/325.  So, his pain is quite severe.  He has bipolar disorder as well and anxiety.  Orthopedic Surgery was consulted to address this due to the nature of the infection.  PROCEDURE DESCRIPTION:  After talking to him about what I needed to do, I prepped his index finger with Betadine alcohol and then provided a 0.25% plain Sensorcaine digital block.  When an adequate anesthesia was obtained, I then cleaned his whole finger with Betadine and using #15 blade on the pulp/volar surface of the index finger tip and once I opened it up, large amount of purulence was expressed.  I then made another incision along the radial dorsal aspect where he had been opened before and got purulence in this area.  I spread the tissue with forceps and then irrigated with 1 liter of normal saline mixed with Betadine through the wound thoroughly.  Xeroform followed by  well-padded sterile dressing was applied, and he tolerated the procedure well.  He was then discharged from the emergency room in stable condition with instructions for follow up in my office in next week and they start daily soaks and Dial soapy water starting tomorrow followed by dressing changes.  We will send him out on Percocet and doxycycline as well.     Vanita Panda. Magnus Ivan, M.D.     CYB/MEDQ  D:  10/23/2011  T:  10/24/2011  Job:  [id number]

## 2011-10-27 NOTE — ED Provider Notes (Signed)
Medical screening examination/treatment/procedure(s) were conducted as a shared visit with non-physician practitioner(s) and myself.  I personally evaluated the patient during the encounter   Pt with abscess of fingertip already seen and drained by EDP and PCP at outside facilities, worsening.  Pt with aches and myalgias and sig pain.  Due to worsening symptoms, discussed with hand surgeon who saw pt in the ED.    [name]. Teshawn Moan, MD 10/27/11 1721

## 2011-12-08 ENCOUNTER — Encounter (HOSPITAL_COMMUNITY): Payer: Self-pay | Admitting: *Deleted

## 2011-12-08 ENCOUNTER — Emergency Department (HOSPITAL_COMMUNITY)
Admission: EM | Admit: 2011-12-08 | Discharge: 2011-12-08 | Disposition: A | Discharge status: Home / Self Care | Payer: Medicare Other | Attending: Emergency Medicine | Admitting: Emergency Medicine

## 2011-12-08 DIAGNOSIS — M79676 Pain in unspecified toe(s): Secondary | ICD-10-CM

## 2011-12-08 DIAGNOSIS — M79609 Pain in unspecified limb: Secondary | ICD-10-CM | POA: Insufficient documentation

## 2011-12-08 DIAGNOSIS — G4700 Insomnia, unspecified: Secondary | ICD-10-CM | POA: Insufficient documentation

## 2011-12-08 DIAGNOSIS — F411 Generalized anxiety disorder: Secondary | ICD-10-CM | POA: Insufficient documentation

## 2011-12-08 DIAGNOSIS — Z87891 Personal history of nicotine dependence: Secondary | ICD-10-CM | POA: Insufficient documentation

## 2011-12-08 HISTORY — DX: Major depressive disorder, single episode, unspecified: F32.9

## 2011-12-08 HISTORY — DX: Depression, unspecified: F32.A

## 2011-12-08 HISTORY — DX: Anxiety disorder, unspecified: F41.9

## 2011-12-08 HISTORY — DX: Insomnia, unspecified: G47.00

## 2011-12-08 MED ORDER — PREDNISONE 20 MG PO TABS
60.0000 mg | ORAL_TABLET | Freq: Once | ORAL | Status: AC
  Administered 2011-12-08: 60 mg via ORAL
  Filled 2011-12-08: qty 3

## 2011-12-08 MED ORDER — PREDNISONE 20 MG PO TABS: ORAL_TABLET | ORAL | Status: DC

## 2011-12-08 MED ORDER — MELOXICAM 15 MG PO TABS: 15.0000 mg | ORAL_TABLET | Freq: Every day | ORAL | Status: DC

## 2011-12-08 MED ORDER — KETOROLAC TROMETHAMINE 60 MG/2ML IM SOLN
60.0000 mg | Freq: Once | INTRAMUSCULAR | Status: AC
  Administered 2011-12-08: 60 mg via INTRAMUSCULAR
  Filled 2011-12-08: qty 2

## 2011-12-08 MED ORDER — OXYCODONE-ACETAMINOPHEN 5-325 MG PO TABS: 2.0000 | ORAL_TABLET | ORAL | Status: DC | PRN

## 2011-12-08 NOTE — ED Notes (Signed)
Pt c/o right great toe pain x 3 days.  Foot is red and swollen, pt states it is very painful and cannot walk on it.

## 2011-12-08 NOTE — ED Notes (Signed)
Pt states that he has been having foot pain for the past 4 days. Pt denies injury, dropping anything on foot or twisting. Pt has a reddened area to right great toe, with warmth to the touch. Pt states he takes percocet for pain and it hasn't touch the pain.

## 2011-12-08 NOTE — ED Provider Notes (Signed)
History     CSN: [id number]  Arrival date & time 12/08/11  0445   First MD Initiated Contact with Patient 12/08/11 [phone]      Chief Complaint  Patient presents with  . Foot Pain    right    (Consider location/radiation/quality/duration/timing/severity/associated sxs/prior treatment) HPI Comments: [name] 46 y.o. male   The chief complaint is: Patient presents with:   Foot Pain - right   46 year old male presents with chief complaint of right great toe pain. States that 2 days ago pain began in the base of the right pain became increasingly worse. Patient states that his right great toe is now red and inflamed. Joints are swollen. Joints are stiff and difficult to move. The pain is increased with walking. He characterizes the pain as constant and throbbing. Rates it at a 10 out of 10. He had Percocet at home that he tried, which he states, "did not touch the pain." He states that he is unable to lie with the sheet over his foot. Denies history of diabetes. Denies history of gout. Does have a family history of an uncle who had gout. Denies, fevers, chills, myalgias, nausea, vomiting, diarrhea, chest pain, shortness of breath. Denies, numbness or tingling in the right foot. He has never had anything like this before.   Patient is a 46 y.o. male presenting with lower extremity pain. The history is provided by the patient and the spouse. No language interpreter was used.  Foot Pain This is a new problem. The current episode started in the past 7 days. The problem occurs constantly. The problem has been gradually worsening. Associated symptoms include joint swelling. Pertinent negatives include no abdominal pain, anorexia, arthralgias, change in bowel habit, chest pain, chills, congestion, coughing, diaphoresis, fatigue, fever, headaches, myalgias, nausea, neck pain, numbness, rash, sore throat, swollen glands, urinary symptoms, vertigo, visual change, vomiting or weakness. The symptoms  are aggravated by bending, walking and standing. He has tried oral narcotics for the symptoms. The treatment provided no relief.    Past Medical History  Diagnosis Date  . Anxiety   . Depression   . Insomnia     History reviewed. No pertinent past surgical history.  History reviewed. No pertinent family history.  History  Substance Use Topics  . Smoking status: Former Games developer  . Smokeless tobacco: Not on file  . Alcohol Use: Yes      Review of Systems  Constitutional: Negative for fever, chills, diaphoresis and fatigue.  HENT: Negative for congestion, sore throat and neck pain.   Respiratory: Negative for cough.   Cardiovascular: Negative for chest pain.  Gastrointestinal: Negative for nausea, vomiting, abdominal pain, anorexia and change in bowel habit.  Musculoskeletal: Positive for joint swelling. Negative for myalgias and arthralgias.  Skin: Negative for rash.  Neurological: Negative for vertigo, weakness, numbness and headaches.    Allergies  Antihistamines, chlorpheniramine-type  Home Medications   Current Outpatient Rx  Name Route Sig Dispense Refill  . ALPRAZOLAM 1 MG PO TABS Oral Take 1 mg by mouth 3 (three) times daily as needed. For anxiety    . CLONAZEPAM 1 MG PO TABS Oral Take 1 mg by mouth at bedtime.    Marland Kitchen ESOMEPRAZOLE MAGNESIUM 40 MG PO CPDR Oral Take 40 mg by mouth daily before breakfast.    . OXYCODONE-ACETAMINOPHEN 10-325 MG PO TABS Oral Take 0.5-1 tablets by mouth every 8 (eight) hours as needed. For pain    . QUETIAPINE FUMARATE ER 200 MG PO TB24 Oral  Take 200 mg by mouth at bedtime.    . VENLAFAXINE HCL ER 150 MG PO CP24 Oral Take 150 mg by mouth every morning.    Marland Kitchen ZOLPIDEM TARTRATE ER 12.5 MG PO TBCR Oral Take 12.5 mg by mouth at bedtime.    . MELOXICAM 15 MG PO TABS Oral Take 1 tablet (15 mg total) by mouth daily. 10 tablet 0  . OXYCODONE-ACETAMINOPHEN 5-325 MG PO TABS Oral Take 2 tablets by mouth every 4 (four) hours as needed for pain. 10  tablet 0  . PREDNISONE 20 MG PO TABS  3 tabs po day one, then 2 po daily x 4 days 11 tablet 0    BP 134/82  Pulse 97  Temp 97.2 F (36.2 C) (Oral)  Resp 16  SpO2 97%  Physical Exam  Nursing note and vitals reviewed. Constitutional: He is oriented to person, place, and time. He appears well-developed and well-nourished. No distress.  HENT:  Head: Normocephalic and atraumatic.  Eyes: Conjunctivae are normal. No scleral icterus.  Neck: Normal range of motion. Neck supple.  Cardiovascular: Normal rate, regular rhythm and normal heart sounds.   Pulmonary/Chest: Effort normal and breath sounds normal. No respiratory distress.  Abdominal: Soft. There is no tenderness.  Musculoskeletal: He exhibits edema and tenderness.       Right ankle: He exhibits decreased range of motion and swelling. He exhibits no ecchymosis, no deformity, no laceration and normal pulse. tenderness. No lateral malleolus tenderness found.       Feet:       Erythema and edema of the metatarsophalangeal joint of the right foot. Range of motion is decreased T2 pain and stiffness. Good peripheral pulses. Cap refill less than 3 second.  Neurological: He is alert and oriented to person, place, and time.  Skin: Skin is warm and dry. He is not diaphoretic. There is erythema (base of right great toe and right great toe).  Psychiatric: His behavior is normal.    ED Course  Procedures (including critical care time)  Labs Reviewed - No data to display No results found.  7:22 AM BP 134/82  Pulse 97  Temp 97.2 F (36.2 C) (Oral)  Resp 16  SpO2 97% Patient presentation, likely reflects first exacerbation of gout. I do not believe he has cellulitis. He has no systemic symptoms. Risk factors are minimal for cellulitis. No mechanism of injury to the toe accounts for possible break or infection. No evidence of discharge. I will treat the patient as if he is having gout with oral steroids and meloxicam. Patient is out of his  Percocet at home and I am giving him enough to cover for the next couple days until he can get in with his primary care physician. I discussed the patient's need to followup with his primary care care physician regarding possibility of gout and methods of prevention. All questions answered fully. 1. Great toe pain       MDM  Patient is safe for discharge. Discussed reasons to seek immediate care. Patient expresses understanding and agrees with plan.         Arthor Captain, PA-C 12/08/11 0725

## 2011-12-09 ENCOUNTER — Encounter (HOSPITAL_COMMUNITY): Payer: Self-pay | Admitting: *Deleted

## 2011-12-09 DIAGNOSIS — K2980 Duodenitis without bleeding: Secondary | ICD-10-CM | POA: Insufficient documentation

## 2011-12-09 DIAGNOSIS — R1031 Right lower quadrant pain: Secondary | ICD-10-CM | POA: Insufficient documentation

## 2011-12-09 DIAGNOSIS — F411 Generalized anxiety disorder: Secondary | ICD-10-CM | POA: Insufficient documentation

## 2011-12-09 DIAGNOSIS — M545 Low back pain, unspecified: Secondary | ICD-10-CM | POA: Insufficient documentation

## 2011-12-09 DIAGNOSIS — Z79899 Other long term (current) drug therapy: Secondary | ICD-10-CM | POA: Insufficient documentation

## 2011-12-09 LAB — CBC WITH DIFFERENTIAL/PLATELET
Basophils Absolute: 0 10*3/uL (ref 0.0–0.1)
Basophils Relative: 0 % (ref 0–1)
Eosinophils Relative: 0 % (ref 0–5)
HCT: 40.5 % (ref 39.0–52.0)
MCHC: 34.6 g/dL (ref 30.0–36.0)
Monocytes Absolute: 0.1 10*3/uL (ref 0.1–1.0)
Neutro Abs: 6.3 10*3/uL (ref 1.7–7.7)
Platelets: 296 10*3/uL (ref 150–400)
RDW: 13.9 % (ref 11.5–15.5)

## 2011-12-09 LAB — ETHANOL: Alcohol, Ethyl (B): <11 mg/dL (ref 0–11)

## 2011-12-09 LAB — URINALYSIS, ROUTINE W REFLEX MICROSCOPIC
Bilirubin Urine: NEGATIVE
Glucose, UA: NEGATIVE mg/dL
Hgb urine dipstick: NEGATIVE
Nitrite: NEGATIVE
Specific Gravity, Urine: 1.018 (ref 1.005–1.030)
pH: 7 (ref 5.0–8.0)

## 2011-12-09 LAB — COMPREHENSIVE METABOLIC PANEL
AST: 20 U/L (ref 0–37)
Albumin: 3.7 g/dL (ref 3.5–5.2)
Calcium: 10.1 mg/dL (ref 8.4–10.5)
Chloride: 101 mEq/L (ref 96–112)
Creatinine, Ser: 1.03 mg/dL (ref 0.50–1.35)

## 2011-12-09 LAB — RAPID URINE DRUG SCREEN, HOSP PERFORMED
Amphetamines: NOT DETECTED
Barbiturates: NOT DETECTED
Benzodiazepines: POSITIVE — AB
Cocaine: NOT DETECTED

## 2011-12-09 NOTE — ED Notes (Signed)
The pt has abd pain andrt sided and rt neck pain with nausea.  He has a sharp pain in his kidney.  He was seen here 2 days ago and diagnosed with  Gout.  He thinks other things are going on now.

## 2011-12-09 NOTE — ED Provider Notes (Signed)
Medical screening examination/treatment/procedure(s) were performed by non-physician practitioner and as supervising physician I was immediately available for consultation/collaboration.   Gavin Pound. Oletta Lamas, MD 12/09/11 1610

## 2011-12-10 ENCOUNTER — Emergency Department (HOSPITAL_COMMUNITY)
Admission: EM | Admit: 2011-12-10 | Discharge: 2011-12-10 | Disposition: A | Discharge status: Home / Self Care | Payer: Medicare Other | Attending: Emergency Medicine | Admitting: Emergency Medicine

## 2011-12-10 ENCOUNTER — Emergency Department (HOSPITAL_COMMUNITY): Payer: Medicare Other

## 2011-12-10 DIAGNOSIS — K2980 Duodenitis without bleeding: Secondary | ICD-10-CM

## 2011-12-10 MED ORDER — GI COCKTAIL ~~LOC~~
30.0000 mL | Freq: Once | ORAL | Status: AC
  Administered 2011-12-10: 30 mL via ORAL
  Filled 2011-12-10: qty 30

## 2011-12-10 MED ORDER — OXYCODONE-ACETAMINOPHEN 5-325 MG PO TABS
2.0000 | ORAL_TABLET | Freq: Once | ORAL | Status: AC
  Administered 2011-12-10: 2 via ORAL
  Filled 2011-12-10: qty 2

## 2011-12-10 MED ORDER — OXYCODONE-ACETAMINOPHEN 5-325 MG PO TABS: 2.0000 | ORAL_TABLET | ORAL | Status: DC | PRN

## 2011-12-10 MED ORDER — FAMOTIDINE 20 MG PO TABS: 20.0000 mg | ORAL_TABLET | Freq: Two times a day (BID) | ORAL | Status: DC

## 2011-12-10 MED ORDER — ONDANSETRON HCL 4 MG/2ML IJ SOLN
INTRAMUSCULAR | Status: AC
  Administered 2011-12-10: 4 mg via INTRAVENOUS
  Filled 2011-12-10: qty 2

## 2011-12-10 MED ORDER — HYDROMORPHONE HCL PF 2 MG/ML IJ SOLN
INTRAMUSCULAR | Status: AC
  Administered 2011-12-10: 2 mg
  Filled 2011-12-10: qty 1

## 2011-12-10 MED ORDER — KETOROLAC TROMETHAMINE 30 MG/ML IJ SOLN
INTRAMUSCULAR | Status: AC
  Administered 2011-12-10: 30 mg
  Filled 2011-12-10: qty 1

## 2011-12-10 MED ORDER — HYDROMORPHONE HCL PF 1 MG/ML IJ SOLN
INTRAMUSCULAR | Status: AC
  Administered 2011-12-10: 1 mg
  Filled 2011-12-10: qty 1

## 2011-12-10 NOTE — ED Notes (Signed)
2mg  dilauded given during down time at 0200

## 2011-12-10 NOTE — ED Provider Notes (Signed)
History     CSN: [id number]  Arrival date & time 12/09/11  2058   First MD Initiated Contact with Patient 12/10/11 0010      Chief Complaint  Patient presents with  . multiple complaints     (Consider location/radiation/quality/duration/timing/severity/associated sxs/prior treatment) HPI Comments: 46 year old male with a history of anxiety, depression, insomnia,, he was seen recently for new onset gout with podagra of his toe, also seen at his family doctor's office yesterday with a confirmed diagnosis of gout. He states that over the evening he developed left flank pain with right abdominal and flank pain and a burning chest pain. He states that there is no swelling, no cough, no shortness of breath, no dysuria, no diarrhea. He states that the pain is "all over my body" but specifically in the flank areas. He has had this sensation intermittently in the past which is a sharp sensation, it usually lasts only several minutes but has been much more severe this evening. He does not have a history of kidney stones.  The history is provided by the patient and the spouse.    Past Medical History  Diagnosis Date  . Anxiety   . Depression   . Insomnia     History reviewed. No pertinent past surgical history.  No family history on file.  History  Substance Use Topics  . Smoking status: Former Games developer  . Smokeless tobacco: Not on file  . Alcohol Use: Yes      Review of Systems  All other systems reviewed and are negative.    Allergies  Antihistamines, chlorpheniramine-type  Home Medications   Current Outpatient Rx  Name Route Sig Dispense Refill  . ALLOPURINOL 300 MG PO TABS Oral Take 300 mg by mouth daily.    Marland Kitchen ALPRAZOLAM 1 MG PO TABS Oral Take 1 mg by mouth 3 (three) times daily as needed. For anxiety    . CLONAZEPAM 1 MG PO TABS Oral Take 1 mg by mouth at bedtime. sleep    . COLCHICINE 0.6 MG PO TABS Oral Take 0.6 mg by mouth 2 (two) times daily.    Marland Kitchen ESOMEPRAZOLE  MAGNESIUM 40 MG PO CPDR Oral Take 40 mg by mouth daily before breakfast.    . MELOXICAM 15 MG PO TABS Oral Take 1 tablet (15 mg total) by mouth daily. 10 tablet 0  . OXYCODONE-ACETAMINOPHEN 5-325 MG PO TABS Oral Take 2 tablets by mouth every 4 (four) hours as needed for pain. 10 tablet 0  . PREDNISONE 20 MG PO TABS Oral Take 40-60 mg by mouth daily. Took 3 tabs on day 1, Then takes 2 tabs daily for 4 days. Then stops, beginning 12/08/11    . QUETIAPINE FUMARATE ER 200 MG PO TB24 Oral Take 200 mg by mouth at bedtime.    . VENLAFAXINE HCL ER 150 MG PO CP24 Oral Take 150 mg by mouth every morning.    Marland Kitchen ZOLPIDEM TARTRATE ER 12.5 MG PO TBCR Oral Take 12.5 mg by mouth at bedtime as needed. sleep    . FAMOTIDINE 20 MG PO TABS Oral Take 1 tablet (20 mg total) by mouth 2 (two) times daily. 30 tablet 0  . OXYCODONE-ACETAMINOPHEN 5-325 MG PO TABS Oral Take 2 tablets by mouth every 4 (four) hours as needed for pain. 15 tablet 0    BP 153/95  Pulse 91  Temp 98.6 F (37 C) (Oral)  Resp 20  SpO2 99%  Physical Exam  Nursing note and vitals reviewed. Constitutional: He  appears well-developed and well-nourished.       Uncomfortable appearing  HENT:  Head: Normocephalic and atraumatic.  Mouth/Throat: Oropharynx is clear and moist. No oropharyngeal exudate.  Eyes: Conjunctivae normal and EOM are normal. Pupils are equal, round, and reactive to light. Right eye exhibits no discharge. Left eye exhibits no discharge. No scleral icterus.  Neck: Normal range of motion. Neck supple. No JVD present. No thyromegaly present.  Cardiovascular: Normal rate, regular rhythm, normal heart sounds and intact distal pulses.  Exam reveals no gallop and no friction rub.   No murmur heard. Pulmonary/Chest: Effort normal and breath sounds normal. No respiratory distress. He has no wheezes. He has no rales.  Abdominal: Soft. Bowel sounds are normal. He exhibits no distension and no mass. There is no tenderness.       Mild  bilateral CVA tenderness  Musculoskeletal: Normal range of motion. He exhibits no edema and no tenderness.  Lymphadenopathy:    He has no cervical adenopathy.  Neurological: He is alert. Coordination normal.  Skin: Skin is warm and dry. No rash noted. No erythema.  Psychiatric: He has a normal mood and affect. His behavior is normal.    ED Course  Procedures (including critical care time)  Labs Reviewed  URINE RAPID DRUG SCREEN (HOSP PERFORMED) - Abnormal; Notable for the following:    Opiates POSITIVE (*)     Benzodiazepines POSITIVE (*)     All other components within normal limits  CBC WITH DIFFERENTIAL - Abnormal; Notable for the following:    RBC 4.08 (*)     MCH 34.3 (*)     Neutrophils Relative 88 (*)     Lymphocytes Relative 10 (*)     Monocytes Relative 2 (*)     All other components within normal limits  COMPREHENSIVE METABOLIC PANEL - Abnormal; Notable for the following:    Glucose, Bld 154 (*)     ALT 62 (*)     Alkaline Phosphatase 198 (*)     GFR calc non Af Amer 85 (*)     All other components within normal limits  LIPASE, BLOOD - Abnormal; Notable for the following:    Lipase 99 (*)     All other components within normal limits  URINALYSIS, ROUTINE W REFLEX MICROSCOPIC  ETHANOL   Ct Abdomen Pelvis Wo Contrast  12/10/2011  *RADIOLOGY REPORT*  Clinical Data: Low back pain.  Right lower quadrant pain.  Nausea.  CT ABDOMEN AND PELVIS WITHOUT CONTRAST  Technique:  Multidetector CT imaging of the abdomen and pelvis was performed following the standard protocol without intravenous contrast.  Comparison: 05/12/2011  Findings: The lung bases are clear.  The kidneys appear symmetrical in size and shape.  No pyelocaliectasis or ureterectasis.  No renal, ureteral, or bladder stones visualized.  Bladder wall is not thickened.  Calcified phleboliths in the pelvis.  Surgical absence of the gallbladder.  The unenhanced appearance of the liver, spleen, pancreas, adrenal glands,  abdominal aorta, and retroperitoneal lymph nodes is unremarkable.  There is a small umbilical hernia containing fat.  This is stable.  There is suggestion of mild infiltration around the duodenal bulb with some wall thickening.  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 Duodenitis or ulcer disease is not excluded.  The stomach is not abnormally distended.  No small bowel distension.  Stool filled colon without distension.  No free air or free fluid in the abdomen.  Pelvis:  There is calcification in the base of the appendix suggesting  appendicolith but the appendix otherwise normal.  No evidence of appendicitis.  Bladder wall is not thickened.  Prostate gland is not enlarged. Calcifications are present in the prostate gland. These are stable.  No free or loculated pelvic fluid collections.  No diverticulitis.  No significant pelvic lymphadenopathy.  Normal alignment of the lumbar vertebrae.  IMPRESSION: No renal or ureteral stone or obstruction.  Suggestion of inflammatory process around the duodenal bulb which could represent duodenitis or ulcer disease.  Clinical correlation recommended. Umbilical hernia containing fat.  Appendicolith without appendicitis.   Original Report Authenticated By: Marlon Pel, M.D.      1. Duodenitis       MDM  At this time the patient appears uncomfortable but is physical exam is fairly unremarkable. Laboratory data suggests that he has a slight elevation in his liver function tests and his lipase, he's has had a cholecystectomy and has had stenting of his biliary system for retained stones in the past several years. His white blood cell count is normal, there is no anemia, there is no signs of urinary infection or hematuria. Pain medications have been given with minimal improvement.  CT scan has been performed but the radiologist at this time is unable to obtain the results, results pending  ED ECG REPORT  I personally interpreted this EKG   Date: 12/09/11   Rate: 87  Rhythm: normal sinus rhythm  QRS Axis: normal  Intervals: normal  ST/T Wave abnormalities: normal  Conduction Disutrbances:none  Narrative Interpretation:   Old EKG Reviewed: Compared with 05/24/2010, Rate has significantly slowed.  Overall the patient has improved significantly after second dose of pain medication, CT scan reviewed with the patient showing duodenitis, no signs of kidney stones. Will start on antihistamine therapy, already on a proton pump inhibitor, abdomen is soft and non-peritoneal, discharge from the     Vida Roller, MD 12/10/11 [phone]

## 2011-12-11 ENCOUNTER — Encounter (HOSPITAL_COMMUNITY): Payer: Self-pay | Admitting: Emergency Medicine

## 2011-12-11 ENCOUNTER — Emergency Department (HOSPITAL_COMMUNITY)
Admission: EM | Admit: 2011-12-11 | Discharge: 2011-12-11 | Disposition: A | Discharge status: Home / Self Care | Payer: Medicare Other | Attending: Emergency Medicine | Admitting: Emergency Medicine

## 2011-12-11 DIAGNOSIS — M549 Dorsalgia, unspecified: Secondary | ICD-10-CM | POA: Insufficient documentation

## 2011-12-11 DIAGNOSIS — R109 Unspecified abdominal pain: Secondary | ICD-10-CM | POA: Insufficient documentation

## 2011-12-11 LAB — URINALYSIS, ROUTINE W REFLEX MICROSCOPIC
Leukocytes, UA: NEGATIVE
Nitrite: NEGATIVE
Specific Gravity, Urine: 1.003 — ABNORMAL LOW (ref 1.005–1.030)
pH: 6 (ref 5.0–8.0)

## 2011-12-11 NOTE — ED Notes (Signed)
Back pain and rt groin pain since 3 days ago has been seen for same recently ? Kidney stone hurst to pee

## 2011-12-15 ENCOUNTER — Emergency Department (HOSPITAL_BASED_OUTPATIENT_CLINIC_OR_DEPARTMENT_OTHER): Payer: Medicare Other

## 2011-12-15 ENCOUNTER — Encounter (HOSPITAL_BASED_OUTPATIENT_CLINIC_OR_DEPARTMENT_OTHER): Payer: Self-pay | Admitting: *Deleted

## 2011-12-15 ENCOUNTER — Observation Stay (HOSPITAL_BASED_OUTPATIENT_CLINIC_OR_DEPARTMENT_OTHER)
Admission: EM | Admit: 2011-12-15 | Discharge: 2011-12-17 | Disposition: A | Discharge status: Home / Self Care | Payer: Medicare Other | Attending: Internal Medicine | Admitting: Internal Medicine

## 2011-12-15 DIAGNOSIS — M79676 Pain in unspecified toe(s): Secondary | ICD-10-CM

## 2011-12-15 DIAGNOSIS — G8929 Other chronic pain: Secondary | ICD-10-CM | POA: Insufficient documentation

## 2011-12-15 DIAGNOSIS — R1011 Right upper quadrant pain: Secondary | ICD-10-CM | POA: Insufficient documentation

## 2011-12-15 DIAGNOSIS — R109 Unspecified abdominal pain: Secondary | ICD-10-CM

## 2011-12-15 DIAGNOSIS — R1031 Right lower quadrant pain: Principal | ICD-10-CM | POA: Insufficient documentation

## 2011-12-15 DIAGNOSIS — F419 Anxiety disorder, unspecified: Secondary | ICD-10-CM

## 2011-12-15 DIAGNOSIS — F329 Major depressive disorder, single episode, unspecified: Secondary | ICD-10-CM | POA: Insufficient documentation

## 2011-12-15 DIAGNOSIS — K861 Other chronic pancreatitis: Secondary | ICD-10-CM

## 2011-12-15 DIAGNOSIS — F32A Depression, unspecified: Secondary | ICD-10-CM

## 2011-12-15 DIAGNOSIS — F411 Generalized anxiety disorder: Secondary | ICD-10-CM | POA: Insufficient documentation

## 2011-12-15 DIAGNOSIS — F3289 Other specified depressive episodes: Secondary | ICD-10-CM | POA: Insufficient documentation

## 2011-12-15 HISTORY — DX: Post-traumatic stress disorder, unspecified: F43.10

## 2011-12-15 HISTORY — DX: Unspecified abdominal pain: R10.9

## 2011-12-15 HISTORY — DX: Cervicalgia: M54.2

## 2011-12-15 HISTORY — DX: Other chronic pain: G89.29

## 2011-12-15 LAB — URINALYSIS, ROUTINE W REFLEX MICROSCOPIC
Bilirubin Urine: NEGATIVE
Nitrite: NEGATIVE
Specific Gravity, Urine: 1.014 (ref 1.005–1.030)
Urobilinogen, UA: 0.2 mg/dL (ref 0.0–1.0)

## 2011-12-15 LAB — CBC WITH DIFFERENTIAL/PLATELET
Eosinophils Absolute: 0 10*3/uL (ref 0.0–0.7)
Hemoglobin: 13.8 g/dL (ref 13.0–17.0)
Lymphocytes Relative: 14 % (ref 12–46)
Lymphs Abs: 1 10*3/uL (ref 0.7–4.0)
MCH: 34.6 pg — ABNORMAL HIGH (ref 26.0–34.0)
Monocytes Relative: 9 % (ref 3–12)
Neutro Abs: 5.3 10*3/uL (ref 1.7–7.7)
Neutrophils Relative %: 76 % (ref 43–77)
RBC: 3.99 MIL/uL — ABNORMAL LOW (ref 4.22–5.81)

## 2011-12-15 LAB — COMPREHENSIVE METABOLIC PANEL
ALT: 23 U/L (ref 0–53)
Calcium: 9.9 mg/dL (ref 8.4–10.5)
GFR calc Af Amer: >90 mL/min (ref >90)
Glucose, Bld: 106 mg/dL — ABNORMAL HIGH (ref 70–99)
Sodium: 139 mEq/L (ref 135–145)
Total Protein: 7 g/dL (ref 6.0–8.3)

## 2011-12-15 LAB — TROPONIN I: Troponin I: <0.3 ng/mL (ref <0.30)

## 2011-12-15 MED ORDER — ONDANSETRON HCL 4 MG/2ML IJ SOLN
INTRAMUSCULAR | Status: AC
  Administered 2011-12-15: 4 mg via INTRAVENOUS
  Filled 2011-12-15: qty 2

## 2011-12-15 MED ORDER — IOHEXOL 300 MG/ML  SOLN
100.0000 mL | Freq: Once | INTRAMUSCULAR | Status: AC | PRN
  Administered 2011-12-15: 100 mL via INTRAVENOUS

## 2011-12-15 MED ORDER — IOHEXOL 300 MG/ML  SOLN: 20.0000 mL | INTRAMUSCULAR | Status: AC

## 2011-12-15 MED ORDER — ONDANSETRON HCL 4 MG/2ML IJ SOLN
4.0000 mg | Freq: Once | INTRAMUSCULAR | Status: AC
Start: 2011-12-15 — End: 2011-12-15
  Administered 2011-12-15: 4 mg via INTRAVENOUS
  Filled 2011-12-15: qty 2

## 2011-12-15 MED ORDER — HYDROMORPHONE HCL PF 1 MG/ML IJ SOLN
1.0000 mg | Freq: Once | INTRAMUSCULAR | Status: AC
  Administered 2011-12-15: 1 mg via INTRAVENOUS
  Filled 2011-12-15: qty 1

## 2011-12-15 MED ORDER — HYDROMORPHONE HCL PF 1 MG/ML IJ SOLN
INTRAMUSCULAR | Status: AC
  Administered 2011-12-15: 1 mg via INTRAVENOUS
  Filled 2011-12-15: qty 1

## 2011-12-15 MED ORDER — SODIUM CHLORIDE 0.9 % IV SOLN
INTRAVENOUS | Status: DC
  Administered 2011-12-15 – 2011-12-16 (×3): via INTRAVENOUS

## 2011-12-15 MED ORDER — SODIUM CHLORIDE 0.9 % IV SOLN
INTRAVENOUS | Status: AC
  Administered 2011-12-16: 02:00:00 via INTRAVENOUS

## 2011-12-15 MED ORDER — ONDANSETRON HCL 4 MG/2ML IJ SOLN
4.0000 mg | Freq: Three times a day (TID) | INTRAMUSCULAR | Status: DC | PRN
  Administered 2011-12-15: 4 mg via INTRAVENOUS

## 2011-12-15 MED ORDER — ONDANSETRON HCL 4 MG/2ML IJ SOLN
4.0000 mg | Freq: Once | INTRAMUSCULAR | Status: AC
  Administered 2011-12-15: 4 mg via INTRAVENOUS
  Filled 2011-12-15: qty 2

## 2011-12-15 MED ORDER — HYDROMORPHONE HCL PF 1 MG/ML IJ SOLN
1.0000 mg | INTRAMUSCULAR | Status: DC | PRN
  Administered 2011-12-15 – 2011-12-16 (×2): 1 mg via INTRAVENOUS
  Filled 2011-12-15: qty 1

## 2011-12-15 MED ORDER — SODIUM CHLORIDE 0.9 % IV BOLUS (SEPSIS): 1000.0000 mL | Freq: Once | INTRAVENOUS | Status: DC

## 2011-12-15 NOTE — ED Notes (Signed)
Right lower quad for x 3 days. Was seen by his MD at [facility] today for same. Sent for further evaluation to r.o appendicitis. Was given a shot of Toradol.

## 2011-12-15 NOTE — Consult Note (Signed)
[name]  05/30/1965 [id number]  CARE TEAM:  PCP: Kirk Ruths, MD  Outpatient Care Team: Patient Care Team: Kirk Ruths, MD as PCP - General (Family Medicine)  Inpatient Treatment Team: Treatment Team: Attending Provider: [name], MD; Registered Nurse: Marga Hoots, RN   This patient is a 46 y.o.male who presents today for surgical evaluation at the request of Dr. Fonnie Jarvis.   I was called by Dr. Fonnie Jarvis concerning this patient.  Patient with the chronic pain on chronic narcotics.  Right lower quadrant pain.  Initially told me in three days.  He's had two CT scans done on the 11th, five days ago,  and tonight.  Both completely normal.  No nausea or vomiting.  No fever.  No white count.  Abdominal pain on the right lower side.  Patient is at [facility] ER.  No diarrhea.  Had a bowel movement yesterday.  There is no problem list on file for this patient.   Past Medical History  Diagnosis Date  . Anxiety   . Depression   . Insomnia     Past Surgical History  Procedure Date  . Cholecystectomy     History   Social History  . Marital Status: Married    Spouse Name: [name]    Number of Children: [name]  . Years of Education: [name]   Occupational History  . Not on file.   Social History Main Topics  . Smoking status: Former Games developer  . Smokeless tobacco: Not on file  . Alcohol Use: Yes  . Drug Use: No  . Sexually Active:    Other Topics Concern  . Not on file   Social History Narrative  . No narrative on file    No family history on file.  Current Facility-Administered Medications  Medication Dose Route Frequency Provider Last Rate Last Dose  . 0.9 %  sodium chloride infusion   Intravenous Continuous [name], MD 125 mL/hr at 12/15/11 1834    . HYDROmorphone (DILAUDID) injection 1 mg  1 mg Intravenous Once [name], MD   1 mg at 12/15/11 1833  . HYDROmorphone (DILAUDID) injection 1 mg  1 mg Intravenous Once [name], MD    1 mg at 12/15/11 1920  . HYDROmorphone (DILAUDID) injection 1 mg  1 mg Intravenous Once [name], MD   1 mg at 12/15/11 2116  . iohexol (OMNIPAQUE) 300 MG/ML solution 100 mL  100 mL Intravenous Once PRN Medication Radiologist, MD   100 mL at 12/15/11 1906  . iohexol (OMNIPAQUE) 300 MG/ML solution 20 mL  20 mL Oral Q1 Hr x 2 Medication Radiologist, MD      . ondansetron (ZOFRAN) injection 4 mg  4 mg Intravenous Once [name], MD   4 mg at 12/15/11 1833  . ondansetron (ZOFRAN) injection 4 mg  4 mg Intravenous Once [name], MD   4 mg at 12/15/11 2116  . sodium chloride 0.9 % bolus 1,000 mL  1,000 mL Intravenous Once [name], MD       Current Outpatient Prescriptions  Medication Sig Dispense Refill  . allopurinol (ZYLOPRIM) 300 MG tablet Take 300 mg by mouth daily.      Marland Kitchen ALPRAZolam (XANAX) 1 MG tablet Take 1 mg by mouth 3 (three) times daily as needed. For anxiety      . clonazePAM (KLONOPIN) 1 MG tablet Take 1 mg by mouth at bedtime. sleep      .  colchicine 0.6 MG tablet Take 0.6 mg by mouth 2 (two) times daily.      Marland Kitchen esomeprazole (NEXIUM) 40 MG capsule Take 40 mg by mouth daily before breakfast.      . famotidine (PEPCID) 20 MG tablet Take 1 tablet (20 mg total) by mouth 2 (two) times daily.  30 tablet  0  . meloxicam (MOBIC) 15 MG tablet Take 1 tablet (15 mg total) by mouth daily.  10 tablet  0  . oxyCODONE-acetaminophen (PERCOCET) 5-325 MG per tablet Take 2 tablets by mouth every 4 (four) hours as needed for pain.  10 tablet  0  . QUEtiapine (SEROQUEL XR) 200 MG 24 hr tablet Take 200 mg by mouth at bedtime.      Marland Kitchen venlafaxine XR (EFFEXOR-XR) 150 MG 24 hr capsule Take 150 mg by mouth every morning. Patient is supposed to use 75 milligrams of this medication.      Marland Kitchen zolpidem (AMBIEN CR) 12.5 MG CR tablet Take 12.5 mg by mouth at bedtime as needed. sleep         Allergies  Allergen Reactions  . Antihistamines, Chlorpheniramine-Type Other (See Comments)    All  antihistamines-Behavior issues    ROS: Constitutional:  No fevers, chills, sweats.  Weight stable Eyes:  No vision changes, No discharge HENT:  No sore throats, nasal drainage Lymph: No neck swelling, No bruising easily Pulmonary:  No cough, productive sputum CV: No orthopnea, PND  No exertional chest/neck/shoulder/arm pain. GI: No personal nor family history of GI/colon cancer, inflammatory bowel disease, irritable bowel syndrome, allergy such as Celiac Sprue, dietary/dairy problems, colitis, ulcers nor gastritis.  No recent sick contacts/gastroenteritis.  No travel outside the country.  No changes in diet. Renal: No UTIs, No hematuria Genital:  No drainage, bleeding, masses Musculoskeletal: No severe joint pain.  Good ROM major joints Skin:  No sores or lesions.  No rashes Heme/Lymph:  No easy bleeding.  No swollen lymph nodes  BP 130/75  Pulse 88  Temp 98.2 F (36.8 C) (Oral)  Resp 18  SpO2 98%    Results:   Labs: Results for orders placed during the hospital encounter of 12/15/11 (from the past 48 hour(s))  URINALYSIS, ROUTINE W REFLEX MICROSCOPIC     Status: Normal   Collection Time   12/15/11  5:25 PM      Component Value Range Comment   Color, Urine YELLOW  YELLOW    APPearance CLEAR  CLEAR    Specific Gravity, Urine 1.014  1.005 - 1.030    pH 7.0  5.0 - 8.0    Glucose, UA NEGATIVE  NEGATIVE mg/dL    Hgb urine dipstick NEGATIVE  NEGATIVE    Bilirubin Urine NEGATIVE  NEGATIVE    Ketones, ur NEGATIVE  NEGATIVE mg/dL    Protein, ur NEGATIVE  NEGATIVE mg/dL    Urobilinogen, UA 0.2  0.0 - 1.0 mg/dL    Nitrite NEGATIVE  NEGATIVE    Leukocytes, UA NEGATIVE  NEGATIVE MICROSCOPIC NOT DONE ON URINES WITH NEGATIVE PROTEIN, BLOOD, LEUKOCYTES, NITRITE, OR GLUCOSE <1000 mg/dL.  TROPONIN I     Status: Normal   Collection Time   12/15/11  6:00 PM      Component Value Range Comment   Troponin I <0.30  <0.30 ng/mL   COMPREHENSIVE METABOLIC PANEL     Status: Abnormal    Collection Time   12/15/11  6:00 PM      Component Value Range Comment   Sodium 139  135 -  145 mEq/L    Potassium 4.1  3.5 - 5.1 mEq/L    Chloride 101  96 - 112 mEq/L    CO2 29  19 - 32 mEq/L    Glucose, Bld 106 (*) 70 - 99 mg/dL    BUN 13  6 - 23 mg/dL    Creatinine, Ser 0.98  0.50 - 1.35 mg/dL    Calcium 9.9  8.4 - 11.9 mg/dL    Total Protein 7.0  6.0 - 8.3 g/dL    Albumin 3.8  3.5 - 5.2 g/dL    AST 17  0 - 37 U/L    ALT 23  0 - 53 U/L    Alkaline Phosphatase 105  39 - 117 U/L    Total Bilirubin 0.2 (*) 0.3 - 1.2 mg/dL    GFR calc non Af Amer 79 (*) >90 mL/min    GFR calc Af Amer >90  >90 mL/min   LIPASE, BLOOD     Status: Abnormal   Collection Time   12/15/11  6:00 PM      Component Value Range Comment   Lipase 100 (*) 11 - 59 U/L   CBC WITH DIFFERENTIAL     Status: Abnormal   Collection Time   12/15/11  6:00 PM      Component Value Range Comment   WBC 6.9  4.0 - 10.5 K/uL    RBC 3.99 (*) 4.22 - 5.81 MIL/uL    Hemoglobin 13.8  13.0 - 17.0 g/dL    HCT 14.7  82.9 - 56.2 %    MCV 100.5 (*) 78.0 - 100.0 fL    MCH 34.6 (*) 26.0 - 34.0 pg    MCHC 34.4  30.0 - 36.0 g/dL    RDW 13.0  86.5 - 78.4 %    Platelets 330  150 - 400 K/uL    Neutrophils Relative 76  43 - 77 %    Neutro Abs 5.3  1.7 - 7.7 K/uL    Lymphocytes Relative 14  12 - 46 %    Lymphs Abs 1.0  0.7 - 4.0 K/uL    Monocytes Relative 9  3 - 12 %    Monocytes Absolute 0.6  0.1 - 1.0 K/uL    Eosinophils Relative 0  0 - 5 %    Eosinophils Absolute 0.0  0.0 - 0.7 K/uL    Basophils Relative 0  0 - 1 %    Basophils Absolute 0.0  0.0 - 0.1 K/uL     Imaging / Studies: Ct Abdomen Pelvis Wo Contrast  12/10/2011  *RADIOLOGY REPORT*  Clinical Data: Low back pain.  Right lower quadrant pain.  Nausea.  CT ABDOMEN AND PELVIS WITHOUT CONTRAST  Technique:  Multidetector CT imaging of the abdomen and pelvis was performed following the standard protocol without intravenous contrast.  Comparison: 05/12/2011  Findings: The lung bases  are clear.  The kidneys appear symmetrical in size and shape.  No pyelocaliectasis or ureterectasis.  No renal, ureteral, or bladder stones visualized.  Bladder wall is not thickened.  Calcified phleboliths in the pelvis.  Surgical absence of the gallbladder.  The unenhanced appearance of the liver, spleen, pancreas, adrenal glands, abdominal aorta, and retroperitoneal lymph nodes is unremarkable.  There is a small umbilical hernia containing fat.  This is stable.  There is suggestion of mild infiltration around the duodenal bulb with some wall thickening.  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 Duodenitis or ulcer disease is not excluded.  The stomach  is not abnormally distended.  No small bowel distension.  Stool filled colon without distension.  No free air or free fluid in the abdomen.  Pelvis:  There is calcification in the base of the appendix suggesting appendicolith but the appendix otherwise normal.  No evidence of appendicitis.  Bladder wall is not thickened.  Prostate gland is not enlarged. Calcifications are present in the prostate gland. These are stable.  No free or loculated pelvic fluid collections.  No diverticulitis.  No significant pelvic lymphadenopathy.  Normal alignment of the lumbar vertebrae.  IMPRESSION: No renal or ureteral stone or obstruction.  Suggestion of inflammatory process around the duodenal bulb which could represent duodenitis or ulcer disease.  Clinical correlation recommended. Umbilical hernia containing fat.  Appendicolith without appendicitis.   Original Report Authenticated By: Marlon Pel, M.D.    Ct Abdomen Pelvis W Contrast  12/15/2011  *RADIOLOGY REPORT*  Clinical Data: The right lower quadrant pain.  Nausea.  CT ABDOMEN AND PELVIS WITH CONTRAST  Technique:  Multidetector CT imaging of the abdomen and pelvis was performed following the standard protocol during bolus administration of intravenous contrast.  Contrast: OMNIPAQUE IOHEXOL 300 MG/ML  SOLN   Comparison: CT scan dated 12/10/2011  Findings: The liver, spleen, pancreas, adrenal glands, and kidneys are normal.  Gallbladder has been removed.  No dilated bile ducts. The subtle inflammation around the duodenal bulb has completely resolved since the prior exam.  Terminal ileum and appendix are normal.  No diverticular disease.  No free air or free fluid.  Osseous structures are normal.  IMPRESSION: Normal CT scan of the abdomen and pelvis.   Original Report Authenticated By: Gwynn Burly, M.D.     Medications / Allergies: per chart  Antibiotics: Anti-infectives    None      Assessment  Danon Ponthieux  46 y.o. male       Problem List:  Active Problems:  * No active hospital problems. *   Lower abdominal pain.  Normal CT scans five days apart.  No fever.  No white count.  Plan:  In discussing her the phone, I am skeptical that appendicitis is a realistic diagnosis with pain this long a normal CAT scan.  I reviewed the film myself.  There is no Stranding around the appendix.  There is no evidence of terminal ileitis.  No terminal ileal thickening.  There was a question of duodenal inflammation five days ago but not for this time tonight.  No masses constipation.  No colitis.No diverticulitis.  No obvious obstructing mass.  No biliary dilatation.  If the patient becomes admitted, we can be asked to see the patient to see if further there are further insights.  However, in light of pain this long without any pathology on 2 CAT scans in the past week,  I am skeptical that an appendectomy as going to solve his pain issues As I am skeptical of appendicitis not being apparent on a CAT scan for symptoms as long with no fever no white count.No left shift.  Dr. Fonnie Jarvis felt it would be reasonable for the patient to be discharged from the ER with close followup with his primary care physician versus discussing with [place] tomorrow.  We will wait to hear what happens.   Ardeth Sportsman, M.D.,  F.A.C.S. Gastrointestinal and Minimally Invasive Surgery [facility] [address] [place], [place] [address] [phone] Main / Paging [phone] Voice Mail   12/15/2011

## 2011-12-15 NOTE — ED Provider Notes (Signed)
History   This chart was scribed for [name], MD by Sofie Rower. The patient was seen in room MH07/MH07 and the patient's care was started at 5:52PM    CSN: [id number]  Arrival date & time 12/15/11  1712   First MD Initiated Contact with Patient 12/15/11 1752      Chief Complaint  Patient presents with  . Abdominal Pain    (Consider location/radiation/quality/duration/timing/severity/associated sxs/prior treatment) Patient is a 46 y.o. male presenting with abdominal pain. The history is provided by the patient. No language interpreter was used.  Abdominal Pain The primary symptoms of the illness include abdominal pain.   [name] is a 46 y.o. male  who presents to the Emergency Department complaining of sudden, progressively worsening, abdominal pain located at the right lower quadrant onset three days ago with associated symptoms of loss of sleep and nausea. The pt reports he ws referred to [facility] by his MD at [facility], for evaluation of possible appendicitis. In addition, the pt reports the last bowel movement he experienced was today, however, he has not ate anything all day as of yet today. Modifying factors include certain movements and positions which intensify the abdominal pain. The pt has a hx of chronic neck pain, chronic RUQ pain, anxiety, insomnia, hypertension and cholecystectomy.   The pt denies vomiting, hematochezia, testicle pain, chest pain, extremity pain, cough, and shortness of breath.   The pt does not smoke, however, he does drink alcohol.   PCP is [facility].    Past Medical History  Diagnosis Date  . Anxiety   . Depression   . Insomnia   . Neck pain   . PTSD (post-traumatic stress disorder)   . Chronic abdominal pain     Past Surgical History  Procedure Date  . Cholecystectomy   . Colonoscopy     Family History  Problem Relation Age of Onset  . Diabetes Mother     History  Substance Use Topics  . Smoking status: Former  Smoker    Types: Cigars  . Smokeless tobacco: Not on file  . Alcohol Use: Yes      Review of Systems  Gastrointestinal: Positive for abdominal pain.    10 Systems reviewed and all are negative for acute change except as noted in the HPI.    Allergies  Antihistamines, chlorpheniramine-type  Home Medications   No current outpatient prescriptions on file.  BP 130/89  Pulse 98  Temp 98.7 F (37.1 C) (Oral)  Resp 20  SpO2 98%  Physical Exam  Nursing note and vitals reviewed. Constitutional:       Awake, alert, nontoxic appearance.  HENT:  Head: Atraumatic.  Mouth/Throat: Oropharynx is clear and moist.  Eyes: Right eye exhibits no discharge. Left eye exhibits no discharge.  Neck: Neck supple.  Cardiovascular: Normal rate, regular rhythm and normal heart sounds.   No murmur heard. Pulmonary/Chest: Effort normal and breath sounds normal. He exhibits no tenderness.  Abdominal: Soft. There is tenderness. There is rebound. Hernia confirmed negative in the right inguinal area and confirmed negative in the left inguinal area.       Moderate tenderness RLQ with localized rebound, Mild tenderness to RUQ. Palpitation to LLQ causes referred pain to RLQ.   Genitourinary: Right testis shows no tenderness. Left testis shows no tenderness.  Musculoskeletal: He exhibits no tenderness.       Baseline ROM, no obvious new focal weakness.  Neurological:       Mental status and  motor strength appears baseline for patient and situation.  Skin: No rash noted.  Psychiatric: He has a normal mood and affect.  no hernias palpated on GU exam  ED Course  Procedures (including critical care time)  ECG: Sinus rhythm, ventricular rate 80, normal axis, normal intervals, no acute ischemic changes noted, no significant change compared with 10September 2013  DIAGNOSTIC STUDIES: Oxygen Saturation is 98% on room air, normal by my interpretation.    COORDINATION OF CARE:    6:02PM- CT scan, UA,  pain management, nausea management, possible appendicitis, and hospital admission discussed. Pt agrees with treatment.   8:42PM- [facility] paged.   8:57PM- Phone consultation with [facility]. Follow up with PCP discussed. Pt prefers Obs admit, [facility] paged, [facility] can consider Surg consult prn.  Results for orders placed during the hospital encounter of 12/15/11  URINALYSIS, ROUTINE W REFLEX MICROSCOPIC      Component Value Range   Color, Urine YELLOW  YELLOW   APPearance CLEAR  CLEAR   Specific Gravity, Urine 1.014  1.005 - 1.030   pH 7.0  5.0 - 8.0   Glucose, UA NEGATIVE  NEGATIVE mg/dL   Hgb urine dipstick NEGATIVE  NEGATIVE   Bilirubin Urine NEGATIVE  NEGATIVE   Ketones, ur NEGATIVE  NEGATIVE mg/dL   Protein, ur NEGATIVE  NEGATIVE mg/dL   Urobilinogen, UA 0.2  0.0 - 1.0 mg/dL   Nitrite NEGATIVE  NEGATIVE   Leukocytes, UA NEGATIVE  NEGATIVE  TROPONIN I      Component Value Range   Troponin I <0.30  <0.30 ng/mL  COMPREHENSIVE METABOLIC PANEL      Component Value Range   Sodium 139  135 - 145 mEq/L   Potassium 4.1  3.5 - 5.1 mEq/L   Chloride 101  96 - 112 mEq/L   CO2 29  19 - 32 mEq/L   Glucose, Bld 106 (*) 70 - 99 mg/dL   BUN 13  6 - 23 mg/dL   Creatinine, Ser 1.61  0.50 - 1.35 mg/dL   Calcium 9.9  8.4 - 09.6 mg/dL   Total Protein 7.0  6.0 - 8.3 g/dL   Albumin 3.8  3.5 - 5.2 g/dL   AST 17  0 - 37 U/L   ALT 23  0 - 53 U/L   Alkaline Phosphatase 105  39 - 117 U/L   Total Bilirubin 0.2 (*) 0.3 - 1.2 mg/dL   GFR calc non Af Amer 79 (*) >90 mL/min   GFR calc Af Amer >90  >90 mL/min  LIPASE, BLOOD      Component Value Range   Lipase 100 (*) 11 - 59 U/L  CBC WITH DIFFERENTIAL      Component Value Range   WBC 6.9  4.0 - 10.5 K/uL   RBC 3.99 (*) 4.22 - 5.81 MIL/uL   Hemoglobin 13.8  13.0 - 17.0 g/dL   HCT 04.5  40.9 - 81.1 %   MCV 100.5 (*) 78.0 - 100.0 fL   MCH 34.6 (*) 26.0 - 34.0 pg   MCHC 34.4  30.0 - 36.0 g/dL   RDW 91.4  78.2 - 95.6 %    Platelets 330  150 - 400 K/uL   Neutrophils Relative 76  43 - 77 %   Neutro Abs 5.3  1.7 - 7.7 K/uL   Lymphocytes Relative 14  12 - 46 %   Lymphs Abs 1.0  0.7 - 4.0 K/uL   Monocytes Relative 9  3 - 12 %  Monocytes Absolute 0.6  0.1 - 1.0 K/uL   Eosinophils Relative 0  0 - 5 %   Eosinophils Absolute 0.0  0.0 - 0.7 K/uL   Basophils Relative 0  0 - 1 %   Basophils Absolute 0.0  0.0 - 0.1 K/uL     Ct Abdomen Pelvis W Contrast  12/15/2011  *RADIOLOGY REPORT*  Clinical Data: The right lower quadrant pain.  Nausea.  CT ABDOMEN AND PELVIS WITH CONTRAST  Technique:  Multidetector CT imaging of the abdomen and pelvis was performed following the standard protocol during bolus administration of intravenous contrast.  Contrast: OMNIPAQUE IOHEXOL 300 MG/ML  SOLN  Comparison: CT scan dated 12/10/2011  Findings: The liver, spleen, pancreas, adrenal glands, and kidneys are normal.  Gallbladder has been removed.  No dilated bile ducts. The subtle inflammation around the duodenal bulb has completely resolved since the prior exam.  Terminal ileum and appendix are normal.  No diverticular disease.  No free air or free fluid.  Osseous structures are normal.  IMPRESSION: Normal CT scan of the abdomen and pelvis.   Original Report Authenticated By: Gwynn Burly, M.D.       ED Dx: Abdominal Pain- uncertain etiology Mildly elevated lipase Chronic RUQ abdominal pain Anxiety Chronic neck pain    MDM  I personally performed the services described in this documentation, which was scribed in my presence. The recorded information has been reviewed and considered. Pt stable in ED with no significant deterioration in condition.Patient / Family / Caregiver informed of clinical course, understand medical decision-making process, and agree with plan.   [name], MD 12/16/11 [phone]

## 2011-12-16 ENCOUNTER — Encounter (HOSPITAL_COMMUNITY): Payer: Self-pay | Admitting: Internal Medicine

## 2011-12-16 ENCOUNTER — Observation Stay (HOSPITAL_COMMUNITY): Payer: Medicare Other

## 2011-12-16 DIAGNOSIS — F419 Anxiety disorder, unspecified: Secondary | ICD-10-CM | POA: Diagnosis present

## 2011-12-16 DIAGNOSIS — F329 Major depressive disorder, single episode, unspecified: Secondary | ICD-10-CM | POA: Diagnosis present

## 2011-12-16 DIAGNOSIS — K861 Other chronic pancreatitis: Secondary | ICD-10-CM | POA: Diagnosis present

## 2011-12-16 DIAGNOSIS — R109 Unspecified abdominal pain: Secondary | ICD-10-CM

## 2011-12-16 MED ORDER — ALLOPURINOL 300 MG PO TABS
300.0000 mg | ORAL_TABLET | Freq: Every day | ORAL | Status: DC
  Administered 2011-12-16 – 2011-12-17 (×2): 300 mg via ORAL
  Filled 2011-12-16 (×2): qty 1

## 2011-12-16 MED ORDER — VENLAFAXINE HCL ER 150 MG PO CP24
150.0000 mg | ORAL_CAPSULE | Freq: Every morning | ORAL | Status: DC
  Administered 2011-12-16 – 2011-12-17 (×2): 150 mg via ORAL
  Filled 2011-12-16 (×2): qty 1

## 2011-12-16 MED ORDER — QUETIAPINE FUMARATE ER 200 MG PO TB24
200.0000 mg | ORAL_TABLET | Freq: Every day | ORAL | Status: DC
  Administered 2011-12-16: 200 mg via ORAL
  Filled 2011-12-16 (×2): qty 1

## 2011-12-16 MED ORDER — MELOXICAM 15 MG PO TABS
15.0000 mg | ORAL_TABLET | Freq: Every day | ORAL | Status: DC
  Administered 2011-12-16 – 2011-12-17 (×2): 15 mg via ORAL
  Filled 2011-12-16 (×2): qty 1

## 2011-12-16 MED ORDER — OXYCODONE-ACETAMINOPHEN 5-325 MG PO TABS
2.0000 | ORAL_TABLET | ORAL | Status: DC | PRN
  Administered 2011-12-16 – 2011-12-17 (×8): 2 via ORAL
  Filled 2011-12-16 (×8): qty 2

## 2011-12-16 MED ORDER — COLCHICINE 0.6 MG PO TABS
0.6000 mg | ORAL_TABLET | Freq: Two times a day (BID) | ORAL | Status: DC
  Administered 2011-12-16 – 2011-12-17 (×3): 0.6 mg via ORAL
  Filled 2011-12-16 (×5): qty 1

## 2011-12-16 MED ORDER — PANTOPRAZOLE SODIUM 40 MG PO TBEC
40.0000 mg | DELAYED_RELEASE_TABLET | Freq: Every day | ORAL | Status: DC
  Administered 2011-12-16: 40 mg via ORAL
  Filled 2011-12-16: qty 1

## 2011-12-16 MED ORDER — ALPRAZOLAM 0.5 MG PO TABS
1.0000 mg | ORAL_TABLET | Freq: Three times a day (TID) | ORAL | Status: DC | PRN
  Administered 2011-12-16 (×2): 1 mg via ORAL
  Filled 2011-12-16 (×3): qty 2

## 2011-12-16 MED ORDER — ALPRAZOLAM 0.5 MG PO TABS
0.5000 mg | ORAL_TABLET | Freq: Once | ORAL | Status: AC
  Administered 2011-12-16: 0.5 mg via ORAL

## 2011-12-16 MED ORDER — FAMOTIDINE 20 MG PO TABS
20.0000 mg | ORAL_TABLET | Freq: Two times a day (BID) | ORAL | Status: DC
  Administered 2011-12-16 – 2011-12-17 (×3): 20 mg via ORAL
  Filled 2011-12-16 (×4): qty 1

## 2011-12-16 MED ORDER — HYDROMORPHONE HCL PF 1 MG/ML IJ SOLN
1.0000 mg | INTRAMUSCULAR | Status: AC | PRN
  Administered 2011-12-16: 1 mg via INTRAVENOUS
  Filled 2011-12-16: qty 1

## 2011-12-16 MED ORDER — SODIUM CHLORIDE 0.9 % IV SOLN: 250.0000 mL | INTRAVENOUS | Status: DC | PRN

## 2011-12-16 MED ORDER — SODIUM CHLORIDE 0.9 % IJ SOLN: 3.0000 mL | INTRAMUSCULAR | Status: DC | PRN

## 2011-12-16 MED ORDER — ONDANSETRON HCL 4 MG/2ML IJ SOLN
4.0000 mg | Freq: Four times a day (QID) | INTRAMUSCULAR | Status: DC | PRN
  Administered 2011-12-16: 4 mg via INTRAVENOUS
  Filled 2011-12-16: qty 2

## 2011-12-16 MED ORDER — ZOLPIDEM TARTRATE 5 MG PO TABS
5.0000 mg | ORAL_TABLET | Freq: Every evening | ORAL | Status: DC | PRN
  Administered 2011-12-16 (×2): 5 mg via ORAL
  Filled 2011-12-16 (×2): qty 1

## 2011-12-16 MED ORDER — ENOXAPARIN SODIUM 40 MG/0.4ML ~~LOC~~ SOLN
40.0000 mg | Freq: Every day | SUBCUTANEOUS | Status: DC
  Administered 2011-12-16: 40 mg via SUBCUTANEOUS
  Filled 2011-12-16 (×2): qty 0.4

## 2011-12-16 MED ORDER — SODIUM CHLORIDE 0.9 % IJ SOLN
3.0000 mL | Freq: Two times a day (BID) | INTRAMUSCULAR | Status: DC
  Administered 2011-12-16: 3 mL via INTRAVENOUS

## 2011-12-16 MED ORDER — ONDANSETRON HCL 4 MG PO TABS: 4.0000 mg | ORAL_TABLET | Freq: Four times a day (QID) | ORAL | Status: DC | PRN

## 2011-12-16 MED ORDER — CLONAZEPAM 1 MG PO TABS
1.0000 mg | ORAL_TABLET | Freq: Every day | ORAL | Status: DC
  Administered 2011-12-16: 1 mg via ORAL
  Filled 2011-12-16: qty 1

## 2011-12-16 NOTE — Progress Notes (Addendum)
PATIENT DETAILS Name: [name] Age: 46 y.o. Sex: male Date of Birth: Jun 18, 1965 Admit Date: 12/15/2011 Admitting Physician Eduard Clos, MD ZOX:[id number] M, MD  Subjective: Coming in with Abdominal pain-has chronic epigastric area pain-but claims his new pain is in the Right lower mid abdomen and RLQ area. He also ran out of his chronic narcotics  Assessment/Plan: Principal Problem:  *Abdominal pain-Acute on Chronic-?pancreatitis -some elevation in lipase, is s/p Cholecystectomy, no ETOH use -CT abdomen(no contrast)  done on 9/11-Duodenitis, CT abdomen(with contrast) 9/16-duodenitis resolved, no CBD dilatation, appendix normal -awaiting Ultrasound Gall bladder -apart from mild pancreatitis-no etiology of pain evident -some element of chronicity-therefore will get records-including EGD-from patient's primary at [facility] -if pain is persistent-will ask GI to see, but for now-c/w IVF,PPI, start clear liquids and follow clinically  Active Problems: -Anxiety -very anxious-likely playing a big role in his symptoms-have reassured him this am-atleast 3 times already -as needed Xanax and qhs Klonopin  Depression/?Bipolar -c/w Seroquel, Effexor  Recent Gouty Flare -currently stable -c/w Colchicine and Allopurinol  Chronic Upper Abd pain and Neck pain -as needed percocet  Disposition: Remain inpatient-home in 1-2 days  DVT Prophylaxis: Prophylactic Lovenox  Code Status: Full code  Procedures:  None  CONSULTS:  None  PHYSICAL EXAM: Vital signs in last 24 hours: Filed Vitals:   12/15/11 2129 12/15/11 2311 12/16/11 0043 12/16/11 0514  BP: 130/75 137/89 130/78 124/80  Pulse: 88 77 71 76  Temp: 98.2 F (36.8 C) 98.5 F (36.9 C) 98.2 F (36.8 C) 98.3 F (36.8 C)  TempSrc: Oral Oral Oral Oral  Resp: 18 18 17 17   Height:   5' 1.32" (1.558 m)   Weight:   106 kg (233 lb 11 oz)   SpO2: 98% 96% 94% 95%    Weight change:  Body mass index  is 43.70 kg/(m^2).   Gen Exam: Awake and alert with clear speech.   Neck: Supple, No JVD.   Chest: B/L Clear.   CVS: S1 S2 Regular, no murmurs.  Abdomen: soft, BS +,  non distended. Mildly tender in Right mid abd area and epigastric area with no rebound or rigidity Extremities: no edema, lower extremities warm to touch. Neurologic: Non Focal.   Skin: No Rash.   Wounds: [name].    Intake/Output from previous day:  Intake/Output Summary (Last 24 hours) at 12/16/11 1123 Last data filed at 12/16/11 0500  Gross per 24 hour  Intake    500 ml  Output      0 ml  Net    500 ml     LAB RESULTS: CBC  Lab 12/15/11 1800 12/09/11 2108  WBC 6.9 7.1  HGB 13.8 14.0  HCT 40.1 40.5  PLT 330 296  MCV 100.5* 99.3  MCH 34.6* 34.3*  MCHC 34.4 34.6  RDW 14.8 13.9  LYMPHSABS 1.0 0.7  MONOABS 0.6 0.1  EOSABS 0.0 0.0  BASOSABS 0.0 0.0  BANDABS -- --    Chemistries   Lab 12/15/11 1800 12/09/11 2108  NA 139 140  K 4.1 3.9  CL 101 101  CO2 29 29  GLUCOSE 106* 154*  BUN 13 11  CREATININE 1.10 1.03  CALCIUM 9.9 10.1  MG -- --    CBG: No results found for this basename: GLUCAP:5 in the last 168 hours  GFR Estimated Creatinine Clearance: 88.1 ml/min (by C-G formula based on Cr of 1.1).  Coagulation profile No results found for this basename: INR:5,PROTIME:5 in the last 168 hours  Cardiac  Enzymes  Lab 12/15/11 1800  CKMB --  TROPONINI <0.30  MYOGLOBIN --    No components found with this basename: POCBNP:3 No results found for this basename: DDIMER:2 in the last 72 hours No results found for this basename: HGBA1C:2 in the last 72 hours No results found for this basename: CHOL:2,HDL:2,LDLCALC:2,TRIG:2,CHOLHDL:2,LDLDIRECT:2 in the last 72 hours No results found for this basename: TSH,T4TOTAL,FREET3,T3FREE,THYROIDAB in the last 72 hours No results found for this basename: VITAMINB12:2,FOLATE:2,FERRITIN:2,TIBC:2,IRON:2,RETICCTPCT:2 in the last 72 hours  Basename 12/15/11 1800    LIPASE 100*  AMYLASE --    Urine Studies No results found for this basename: UACOL:2,UAPR:2,USPG:2,UPH:2,UTP:2,UGL:2,UKET:2,UBIL:2,UHGB:2,UNIT:2,UROB:2,ULEU:2,UEPI:2,UWBC:2,URBC:2,UBAC:2,CAST:2,CRYS:2,UCOM:2,BILUA:2 in the last 72 hours  MICROBIOLOGY: No results found for this or any previous visit (from the past 240 hour(s)).  RADIOLOGY STUDIES/RESULTS: Ct Abdomen Pelvis Wo Contrast  12/10/2011  *RADIOLOGY REPORT*  Clinical Data: Low back pain.  Right lower quadrant pain.  Nausea.  CT ABDOMEN AND PELVIS WITHOUT CONTRAST  Technique:  Multidetector CT imaging of the abdomen and pelvis was performed following the standard protocol without intravenous contrast.  Comparison: 05/12/2011  Findings: The lung bases are clear.  The kidneys appear symmetrical in size and shape.  No pyelocaliectasis or ureterectasis.  No renal, ureteral, or bladder stones visualized.  Bladder wall is not thickened.  Calcified phleboliths in the pelvis.  Surgical absence of the gallbladder.  The unenhanced appearance of the liver, spleen, pancreas, adrenal glands, abdominal aorta, and retroperitoneal lymph nodes is unremarkable.  There is a small umbilical hernia containing fat.  This is stable.  There is suggestion of mild infiltration around the duodenal bulb with some wall thickening.  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 Duodenitis or ulcer disease is not excluded.  The stomach is not abnormally distended.  No small bowel distension.  Stool filled colon without distension.  No free air or free fluid in the abdomen.  Pelvis:  There is calcification in the base of the appendix suggesting appendicolith but the appendix otherwise normal.  No evidence of appendicitis.  Bladder wall is not thickened.  Prostate gland is not enlarged. Calcifications are present in the prostate gland. These are stable.  No free or loculated pelvic fluid collections.  No diverticulitis.  No significant pelvic lymphadenopathy.  Normal alignment  of the lumbar vertebrae.  IMPRESSION: No renal or ureteral stone or obstruction.  Suggestion of inflammatory process around the duodenal bulb which could represent duodenitis or ulcer disease.  Clinical correlation recommended. Umbilical hernia containing fat.  Appendicolith without appendicitis.   Original Report Authenticated By: Marlon Pel, M.D.    Ct Abdomen Pelvis W Contrast  12/15/2011  *RADIOLOGY REPORT*  Clinical Data: The right lower quadrant pain.  Nausea.  CT ABDOMEN AND PELVIS WITH CONTRAST  Technique:  Multidetector CT imaging of the abdomen and pelvis was performed following the standard protocol during bolus administration of intravenous contrast.  Contrast: OMNIPAQUE IOHEXOL 300 MG/ML  SOLN  Comparison: CT scan dated 12/10/2011  Findings: The liver, spleen, pancreas, adrenal glands, and kidneys are normal.  Gallbladder has been removed.  No dilated bile ducts. The subtle inflammation around the duodenal bulb has completely resolved since the prior exam.  Terminal ileum and appendix are normal.  No diverticular disease.  No free air or free fluid.  Osseous structures are normal.  IMPRESSION: Normal CT scan of the abdomen and pelvis.   Original Report Authenticated By: Gwynn Burly, M.D.     MEDICATIONS: Scheduled Meds:   . sodium chloride  Intravenous STAT  . allopurinol  300 mg Oral Daily  . ALPRAZolam  0.5 mg Oral Once  . clonazePAM  1 mg Oral QHS  . colchicine  0.6 mg Oral BID  . famotidine  20 mg Oral BID  .  HYDROmorphone (DILAUDID) injection  1 mg Intravenous Once  .  HYDROmorphone (DILAUDID) injection  1 mg Intravenous Once  .  HYDROmorphone (DILAUDID) injection  1 mg Intravenous Once  . iohexol  20 mL Oral Q1 Hr x 2  . meloxicam  15 mg Oral Daily  . ondansetron (ZOFRAN) IV  4 mg Intravenous Once  . ondansetron (ZOFRAN) IV  4 mg Intravenous Once  . pantoprazole  40 mg Oral Q1200  . QUEtiapine  200 mg Oral QHS  . sodium chloride  1,000 mL Intravenous  Once  . sodium chloride  3 mL Intravenous Q12H  . venlafaxine XR  150 mg Oral q morning - 10a   Continuous Infusions:   . sodium chloride 125 mL/hr at 12/16/11 0949   PRN Meds:.sodium chloride, ALPRAZolam, HYDROmorphone (DILAUDID) injection, iohexol, ondansetron (ZOFRAN) IV, ondansetron, oxyCODONE-acetaminophen, sodium chloride, zolpidem, DISCONTD:  HYDROmorphone (DILAUDID) injection, DISCONTD: ondansetron (ZOFRAN) IV  Antibiotics: Anti-infectives    None       Jeoffrey Massed, MD  [facility] Pager:[phone]  If 7PM-7AM, please contact night-coverage [email or web address] Password [email or web address] 12/16/2011, 11:23 AM   LOS: 1 day

## 2011-12-16 NOTE — Progress Notes (Signed)
Pt arrived via stretcher to [id number].  MD paged about patient's arrival.  Oriented pt to room and surroundings.  Pt c/o abdominal pain.  Pt able to ambulate to bathroom without assistance.  Denies any other complaints.  Will continue to monitor.

## 2011-12-16 NOTE — H&P (Signed)
[facility] History and Physical  [name] ZOX:[id number] DOB: 09-28-1965 DOA: 12/15/2011  Referring physician: Dr. Hassie Bruce PCP: Kirk Ruths, MD   Chief Complaint: Abdominal pain  HPI: [name] is a 46 y.o. male who apparently presented to the ED earlier this evening with complaint of RUQ and RLQ as well as epigastric abdominal pain.  Pain onset 3 years ago, with a chronic waxing and waning course.  Pain has been persistent since the patient had his gallbladder removed he states.  It was particularly worse today he states because he had run out of his chronic pain medication and so he presented to the ED.  His pain is not exacerbated by eating he states nor does eating any particular food seem to make it worse with the one exception of for leafy green vegetables which he avoids.  He characterizes the pain as "like having a gallstone still in there"  In the ED a CT scan of the abdomen was read as normal showing no pathology.  His laboratory work did however show a chronic but slightly elevated Lipase.  The patient was admitted as a transfer.  Review of Systems: The patient denies anorexia, fever, weight loss, vision loss, decreased hearing, hoarseness, chest pain, syncope, dyspnea on exertion, peripheral edema, balance deficits, hemoptysis, does have abdominal pain, denies melena, hematochezia, severe indigestion/heartburn, hematuria, incontinence, genital sores, muscle weakness, suspicious skin lesions, transient blindness, difficulty walking, depression, unusual weight change, abnormal bleeding, enlarged lymph nodes, angioedema, and breast masses.  Past Medical History  Diagnosis Date  . Anxiety   . Depression   . Insomnia   . Neck pain   . PTSD (post-traumatic stress disorder)   . Chronic abdominal pain    Past Surgical History  Procedure Date  . Cholecystectomy   . Colonoscopy    Social History:  reports that he has quit smoking. His smoking use included Cigars. He  does not have any smokeless tobacco history on file. He reports that he drinks alcohol. He reports that he uses illicit drugs. Patient lives at home, is married, performs most ADLs  Allergies  Allergen Reactions  . Antihistamines, Chlorpheniramine-Type Other (See Comments)    All antihistamines-Behavior issues    Family History  Problem Relation Age of Onset  . Diabetes Mother     Prior to Admission medications   Medication Sig Start Date End Date Taking? Authorizing Provider  allopurinol (ZYLOPRIM) 300 MG tablet Take 300 mg by mouth daily.   Yes Historical Provider, MD  ALPRAZolam Prudy Feeler) 1 MG tablet Take 1 mg by mouth 3 (three) times daily as needed. For anxiety   Yes Historical Provider, MD  clonazePAM (KLONOPIN) 1 MG tablet Take 1 mg by mouth at bedtime. sleep   Yes Historical Provider, MD  colchicine 0.6 MG tablet Take 0.6 mg by mouth 2 (two) times daily.   Yes Historical Provider, MD  esomeprazole (NEXIUM) 40 MG capsule Take 40 mg by mouth daily before breakfast.   Yes Historical Provider, MD  famotidine (PEPCID) 20 MG tablet Take 1 tablet (20 mg total) by mouth 2 (two) times daily. 12/10/11 12/09/12 Yes Vida Roller, MD  meloxicam (MOBIC) 15 MG tablet Take 1 tablet (15 mg total) by mouth daily. 12/08/11 12/07/12 Yes Abigail Harris, PA-C  oxyCODONE-acetaminophen (PERCOCET) 5-325 MG per tablet Take 2 tablets by mouth every 4 (four) hours as needed for pain. 12/08/11 12/18/11 Yes Abigail Harris, PA-C  QUEtiapine (SEROQUEL XR) 200 MG 24 hr tablet Take 200 mg by mouth at  bedtime.   Yes Historical Provider, MD  venlafaxine XR (EFFEXOR-XR) 150 MG 24 hr capsule Take 150 mg by mouth every morning. Patient is supposed to use 75 milligrams of this medication.   Yes Historical Provider, MD  zolpidem (AMBIEN CR) 12.5 MG CR tablet Take 12.5 mg by mouth at bedtime as needed. sleep   Yes Historical Provider, MD   Physical Exam: Filed Vitals:   12/15/11 1725 12/15/11 2129 12/15/11 2311 12/16/11 0043    BP: 130/89 130/75 137/89 130/78  Pulse: 98 88 77 71  Temp: 98.7 F (37.1 C) 98.2 F (36.8 C) 98.5 F (36.9 C) 98.2 F (36.8 C)  TempSrc: Oral Oral Oral Oral  Resp: 20 18 18 17   Height:    5' 1.32" (1.558 m)  Weight:    106 kg (233 lb 11 oz)  SpO2: 98% 98% 96% 94%     General:  NAD, resting comfortably in hospital bed  Eyes: PEERLA EOMI  ENT: mucous membranes moist  Neck: supple w/o JVD  Cardiovascular: RRR w/o MRG  Respiratory: CTA B  Abdomen: soft, mild tenderness in the RUQ, RLQ, mild to moderate tenderness in the epigastric area, no rebound, no guarding, no rigidity  Skin: W/D/I  Psychiatric: Somewhat flat affect, patient does seem somewhat anxious  Neurologic: Grossly non-focal  Labs on Admission:  Basic Metabolic Panel:  Lab 12/15/11 1610 12/09/11 2108  NA 139 140  K 4.1 3.9  CL 101 101  CO2 29 29  GLUCOSE 106* 154*  BUN 13 11  CREATININE 1.10 1.03  CALCIUM 9.9 10.1  MG -- --  PHOS -- --   Liver Function Tests:  Lab 12/15/11 1800 12/09/11 2108  AST 17 20  ALT 23 62*  ALKPHOS 105 198*  BILITOT 0.2* 0.3  PROT 7.0 7.3  ALBUMIN 3.8 3.7    Lab 12/15/11 1800 12/09/11 2108  LIPASE 100* 99*  AMYLASE -- --   No results found for this basename: AMMONIA:5 in the last 168 hours CBC:  Lab 12/15/11 1800 12/09/11 2108  WBC 6.9 7.1  NEUTROABS 5.3 6.3  HGB 13.8 14.0  HCT 40.1 40.5  MCV 100.5* 99.3  PLT 330 296   Cardiac Enzymes:  Lab 12/15/11 1800  CKTOTAL --  CKMB --  CKMBINDEX --  TROPONINI <0.30    BNP (last 3 results) No results found for this basename: PROBNP:3 in the last 8760 hours CBG: No results found for this basename: GLUCAP:5 in the last 168 hours  Radiological Exams on Admission: Ct Abdomen Pelvis W Contrast  12/15/2011  *RADIOLOGY REPORT*  Clinical Data: The right lower quadrant pain.  Nausea.  CT ABDOMEN AND PELVIS WITH CONTRAST  Technique:  Multidetector CT imaging of the abdomen and pelvis was performed following the  standard protocol during bolus administration of intravenous contrast.  Contrast: OMNIPAQUE IOHEXOL 300 MG/ML  SOLN  Comparison: CT scan dated 12/10/2011  Findings: The liver, spleen, pancreas, adrenal glands, and kidneys are normal.  Gallbladder has been removed.  No dilated bile ducts. The subtle inflammation around the duodenal bulb has completely resolved since the prior exam.  Terminal ileum and appendix are normal.  No diverticular disease.  No free air or free fluid.  Osseous structures are normal.  IMPRESSION: Normal CT scan of the abdomen and pelvis.   Original Report Authenticated By: Gwynn Burly, M.D.     EKG: Independently reviewed.  Assessment/Plan Principal Problem:  *Abdominal pain Active Problems:  Chronic pancreatitis   1. Abdominal pain -  this does seem to be a chronic issue for the patient having been chronic with a waxing and waning course over the past 3 years, I doubt that there is a retained stone but will order a RUQ ultrasound to be certain.  More likely this represents a flare of chronic pancreatitis as evidenced by his very mildly elevated lipase chronically on previous admits.  Not at all showing peritoneal signs on my abdominal exam, has no elevated WBC, nor findings worrisome for acute pancreatitis at this point in time although certainly we will monitor to see if he develops any of these over night. 2. Chronic Pancreatitis - likely present given the chronically elevated lipase on previous evaluations and this hospital stay as well.  No evidence of a pseudocyst on CT scan (nor evidence of pancreatitis for that matter).  Will treat symptomatically for now. 3. Depression/ Anxiety / Insomnia / PTSD - plan to continue home meds  Code Status: Full code Family Communication: Wife will be present tomorrow he states Disposition Plan: Admit to Obs  Time spent: 45 min  Claudeen Leason M. [facility] Pager [phone]  If 7PM-7AM, please contact  night-coverage [email or web address] Password [email or web address] 12/16/2011, 3:43 AM

## 2011-12-17 DIAGNOSIS — F411 Generalized anxiety disorder: Secondary | ICD-10-CM

## 2011-12-17 DIAGNOSIS — F329 Major depressive disorder, single episode, unspecified: Secondary | ICD-10-CM

## 2011-12-17 LAB — CBC
Hemoglobin: 13.3 g/dL (ref 13.0–17.0)
MCH: 34.3 pg — ABNORMAL HIGH (ref 26.0–34.0)
Platelets: 277 10*3/uL (ref 150–400)
RBC: 3.88 MIL/uL — ABNORMAL LOW (ref 4.22–5.81)
WBC: 4.7 10*3/uL (ref 4.0–10.5)

## 2011-12-17 LAB — BASIC METABOLIC PANEL
CO2: 30 mEq/L (ref 19–32)
Calcium: 9.2 mg/dL (ref 8.4–10.5)
Chloride: 107 mEq/L (ref 96–112)
Glucose, Bld: 84 mg/dL (ref 70–99)
Potassium: 4.1 mEq/L (ref 3.5–5.1)
Sodium: 141 mEq/L (ref 135–145)

## 2011-12-17 LAB — LIPASE, BLOOD: Lipase: 58 U/L (ref 11–59)

## 2011-12-17 MED ORDER — OXYCODONE-ACETAMINOPHEN 5-325 MG PO TABS: 2.0000 | ORAL_TABLET | ORAL | Status: AC | PRN

## 2011-12-17 MED ORDER — ESOMEPRAZOLE MAGNESIUM 40 MG PO CPDR: 40.0000 mg | DELAYED_RELEASE_CAPSULE | Freq: Every day | ORAL | Status: DC

## 2011-12-17 NOTE — Progress Notes (Signed)
[name] [id number] Discharge Data: 12/17/2011 10:35 AM Attending Provider: Clydia Llano, MD WUJ:[id number],NWGNFAO Judie Petit, MD     Myles Rosenthal to be D/C'd Home per MD order.  Discussed with the patient the After Visit Summary and all questions fully answered. All IV's discontinued with no bleeding noted. All belongings returned to patient for patient to take home.   Last Vital Signs:  Blood pressure 129/85, pulse 64, temperature 98.1 F (36.7 C), temperature source Oral, resp. rate 20, height 5' 1.32" (1.558 m), weight 106 kg (233 lb 11 oz), SpO2 98.00%.  Discharge Medication List   Medication List     As of 12/17/2011 10:35 AM    TAKE these medications         allopurinol 300 MG tablet   Commonly known as: ZYLOPRIM   Take 300 mg by mouth daily.      ALPRAZolam 1 MG tablet   Commonly known as: XANAX   Take 1 mg by mouth 3 (three) times daily as needed. For anxiety      clonazePAM 1 MG tablet   Commonly known as: KLONOPIN   Take 1 mg by mouth at bedtime. sleep      colchicine 0.6 MG tablet   Take 0.6 mg by mouth 2 (two) times daily.      esomeprazole 40 MG capsule   Commonly known as: NEXIUM   Take 1 capsule (40 mg total) by mouth daily before breakfast.      famotidine 20 MG tablet   Commonly known as: PEPCID   Take 1 tablet (20 mg total) by mouth 2 (two) times daily.      meloxicam 15 MG tablet   Commonly known as: MOBIC   Take 1 tablet (15 mg total) by mouth daily.      oxyCODONE-acetaminophen 5-325 MG per tablet   Commonly known as: PERCOCET/ROXICET   Take 2 tablets by mouth every 4 (four) hours as needed for pain.      QUEtiapine 200 MG 24 hr tablet   Commonly known as: SEROQUEL XR   Take 200 mg by mouth at bedtime.      venlafaxine XR 150 MG 24 hr capsule   Commonly known as: EFFEXOR-XR   Take 150 mg by mouth every morning. Patient is supposed to use 75 milligrams of this medication.      zolpidem 12.5 MG CR tablet   Commonly known as: AMBIEN CR   Take  12.5 mg by mouth at bedtime as needed. sleep        Rosalie Doctor, RN

## 2011-12-17 NOTE — Progress Notes (Signed)
Called to patient's room.  Pt discussed his thoughts about the cause of his abdominal pain; stated "I think I'm going to start making some changes to my lifestyle.  I think what could be causing my pain is acid in my stomach.  And I've been told before that I have a lot of acid on my stomach, so I was given nexium.  I stopped taking it because it was $100. I used to drink a lot of alcohol.  My last drink was on Saturday at my niece's wedding.  I think I'm going to stop and stop smoking cigars.  My pain has gotten better since talking about it."

## 2011-12-17 NOTE — Discharge Summary (Signed)
Physician Discharge Summary  [name] ZOX:[id number] DOB: 12-May-1965 DOA: 12/15/2011  PCP: Karle Plumber, MD  Admit date: 12/15/2011 Discharge date: 12/17/2011  Recommendations for Outpatient Follow-up:  1. Followup with primary care physician  Discharge Diagnoses:  Principal Problem:  *Abdominal pain Active Problems:  Chronic pancreatitis  Anxiety  Depression   Discharge Condition: Stable  Diet recommendation: Regular  Filed Weights   12/16/11 0043  Weight: 106 kg (233 lb 11 oz)    History of present illness:  [name] is a 45 y.o. male who apparently presented to the ED earlier this evening with complaint of RUQ and RLQ as well as epigastric abdominal pain. Pain onset 3 years ago, with a chronic waxing and waning course. Pain has been persistent since the patient had his gallbladder removed he states. It was particularly worse today he states because he had run out of his chronic pain medication and so he presented to the ED. His pain is not exacerbated by eating he states nor does eating any particular food seem to make it worse with the one exception of for leafy green vegetables which he avoids. He characterizes the pain as "like having a gallstone still in there"  In the ED a CT scan of the abdomen was read as normal showing no pathology. His laboratory work did however show a chronic but slightly elevated Lipase. The patient was admitted as a transfer  Hospital Course:   1. Abdominal pain: Patient has acute on chronic abdominal pain, he has a history of cholecystectomy, there is no alcohol abuse. Patient had a CT scan of abdomen pelvis done on 12/10/2011 prior to this admission and showed duodenitis. Upon time of this admission CT scan of abdomen and pelvis was repeated on 12/15/2011 and showed the due to pneumonitis resolved completely and there is no other abnormalities of the abdomen and pelvis. The lipase was slightly elevated at 99, which is went down to 58 at  the day of discharge. Please note that patient does not have any evidence of acute pancreatitis on his CT scan. Ultrasound of gallbladder was done also and showed no evidence of acute abnormalities. Meanwhile patient pain is getting better after she was started on Protonix in the hospital. Patient mentioned that he was not very adherent to his Nexium at home. The pain improved, and patient tolerated his diet well.  2. Anxiety: Patient is very anxious this is probably is playing a big role in his symptoms, he is been reassured more than once, his home regimen of anxiolytics continued throughout his hospital stay.  3. Bipolar/depression: Patient follows with his primary care physician, he is on Seroquel and Effexor.  4. Recent flare of gouty arthritis: Currently stable, patient is on colchicine and allopurinol, and his medication was continued throughout his hospital stay.  5. Chronic pain: Patient has chronic upper abdominal and neck pain, and he takes as needed Percocet at home. Patient mentioned that he has been contact with his primary care physician, I prescribed only 20 pills of Percocet for him till he sees his primary care physician. He did not ask me to prescribe pain medications, but he said he is not sure he does have any left at home.  Procedures:  None  Consultations:  None  Discharge Exam: Filed Vitals:   12/16/11 0514 12/16/11 1507 12/16/11 2300 12/17/11 0610  BP: 124/80 125/75 104/67 129/85  Pulse: 76 75 69 64  Temp: 98.3 F (36.8 C) 98.5 F (36.9 C) 97.9 F (36.6 C)  98.1 F (36.7 C)  TempSrc: Oral Oral Oral Oral  Resp: 17 18 18 20   Height:      Weight:      SpO2: 95% 95% 95% 98%   General: Alert and awake, oriented x3, not in any acute distress. HEENT: anicteric sclera, pupils reactive to light and accommodation, EOMI CVS: S1-S2 clear, no murmur rubs or gallops Chest: clear to auscultation bilaterally, no wheezing, rales or rhonchi Abdomen: soft nontender,  nondistended, normal bowel sounds, no organomegaly Extremities: no cyanosis, clubbing or edema noted bilaterally Neuro: Cranial nerves II-XII intact, no focal neurological deficits   Discharge Instructions  Discharge Orders    Future Orders Please Complete By Expires   Diet - low sodium heart healthy      Increase activity slowly          Medication List     As of 12/17/2011  9:46 AM    TAKE these medications         allopurinol 300 MG tablet   Commonly known as: ZYLOPRIM   Take 300 mg by mouth daily.      ALPRAZolam 1 MG tablet   Commonly known as: XANAX   Take 1 mg by mouth 3 (three) times daily as needed. For anxiety      clonazePAM 1 MG tablet   Commonly known as: KLONOPIN   Take 1 mg by mouth at bedtime. sleep      colchicine 0.6 MG tablet   Take 0.6 mg by mouth 2 (two) times daily.      esomeprazole 40 MG capsule   Commonly known as: NEXIUM   Take 1 capsule (40 mg total) by mouth daily before breakfast.      famotidine 20 MG tablet   Commonly known as: PEPCID   Take 1 tablet (20 mg total) by mouth 2 (two) times daily.      meloxicam 15 MG tablet   Commonly known as: MOBIC   Take 1 tablet (15 mg total) by mouth daily.      oxyCODONE-acetaminophen 5-325 MG per tablet   Commonly known as: PERCOCET/ROXICET   Take 2 tablets by mouth every 4 (four) hours as needed for pain.      QUEtiapine 200 MG 24 hr tablet   Commonly known as: SEROQUEL XR   Take 200 mg by mouth at bedtime.      venlafaxine XR 150 MG 24 hr capsule   Commonly known as: EFFEXOR-XR   Take 150 mg by mouth every morning. Patient is supposed to use 75 milligrams of this medication.      zolpidem 12.5 MG CR tablet   Commonly known as: AMBIEN CR   Take 12.5 mg by mouth at bedtime as needed. sleep           Follow-up Information    Follow up with Karle Plumber, MD. In 1 week.   Contact information:   [address] [place] [address]           The results of significant  diagnostics from this hospitalization (including imaging, microbiology, ancillary and laboratory) are listed below for reference.    Significant Diagnostic Studies: Ct Abdomen Pelvis Wo Contrast  12/10/2011  *RADIOLOGY REPORT*  Clinical Data: Low back pain.  Right lower quadrant pain.  Nausea.  CT ABDOMEN AND PELVIS WITHOUT CONTRAST  Technique:  Multidetector CT imaging of the abdomen and pelvis was performed following the standard protocol without intravenous contrast.  Comparison: 05/12/2011  Findings: The lung bases  are clear.  The kidneys appear symmetrical in size and shape.  No pyelocaliectasis or ureterectasis.  No renal, ureteral, or bladder stones visualized.  Bladder wall is not thickened.  Calcified phleboliths in the pelvis.  Surgical absence of the gallbladder.  The unenhanced appearance of the liver, spleen, pancreas, adrenal glands, abdominal aorta, and retroperitoneal lymph nodes is unremarkable.  There is a small umbilical hernia containing fat.  This is stable.  There is suggestion of mild infiltration around the duodenal bulb with some wall thickening.  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 Duodenitis or ulcer disease is not excluded.  The stomach is not abnormally distended.  No small bowel distension.  Stool filled colon without distension.  No free air or free fluid in the abdomen.  Pelvis:  There is calcification in the base of the appendix suggesting appendicolith but the appendix otherwise normal.  No evidence of appendicitis.  Bladder wall is not thickened.  Prostate gland is not enlarged. Calcifications are present in the prostate gland. These are stable.  No free or loculated pelvic fluid collections.  No diverticulitis.  No significant pelvic lymphadenopathy.  Normal alignment of the lumbar vertebrae.  IMPRESSION: No renal or ureteral stone or obstruction.  Suggestion of inflammatory process around the duodenal bulb which could represent duodenitis or ulcer disease.  Clinical  correlation recommended. Umbilical hernia containing fat.  Appendicolith without appendicitis.   Original Report Authenticated By: Marlon Pel, M.D.    US Abdomen Complete  12/16/2011  *RADIOLOGY REPORT*  Clinical Data:  Abdominal pain  COMPLETE ABDOMINAL ULTRASOUND  Comparison:  CT scan abdomen and pelvis 12/15/2011  Findings:  Gallbladder:  Surgically absent  Common bile duct:  Measures 9 mm in diameter probable post cholecystectomy  Liver:  No focal lesion identified.  Within normal limits in parenchymal echogenicity.  IVC:  Appears normal.  Pancreas:  No focal abnormality seen.  Spleen:  Measures 5.8 cm in length.  Normal echogenicity.  Right Kidney:  Measures 11.4 cm in length.  No mass, hydronephrosis or diagnostic renal calculus  Left Kidney:  Measures 11.7 cm in length.  No mass, hydronephrosis or diagnostic renal calculus  Abdominal aorta:  No aneurysm identified. Measures up to 2.5 cm in diameter.  IMPRESSION: Negative abdominal ultrasound. Surgically absent gallbladder.   Original Report Authenticated By: Natasha Mead, M.D.    Ct Abdomen Pelvis W Contrast  12/15/2011  *RADIOLOGY REPORT*  Clinical Data: The right lower quadrant pain.  Nausea.  CT ABDOMEN AND PELVIS WITH CONTRAST  Technique:  Multidetector CT imaging of the abdomen and pelvis was performed following the standard protocol during bolus administration of intravenous contrast.  Contrast: OMNIPAQUE IOHEXOL 300 MG/ML  SOLN  Comparison: CT scan dated 12/10/2011  Findings: The liver, spleen, pancreas, adrenal glands, and kidneys are normal.  Gallbladder has been removed.  No dilated bile ducts. The subtle inflammation around the duodenal bulb has completely resolved since the prior exam.  Terminal ileum and appendix are normal.  No diverticular disease.  No free air or free fluid.  Osseous structures are normal.  IMPRESSION: Normal CT scan of the abdomen and pelvis.   Original Report Authenticated By: Gwynn Burly, M.D.      Microbiology: No results found for this or any previous visit (from the past 240 hour(s)).   Labs: Basic Metabolic Panel:  Lab 12/17/11 1610 12/15/11 1800  NA 141 139  K 4.1 4.1  CL 107 101  CO2 30 29  GLUCOSE 84 106*  BUN 7 13  CREATININE 1.08 1.10  CALCIUM 9.2 9.9  MG -- --  PHOS -- --   Liver Function Tests:  Lab 12/15/11 1800  AST 17  ALT 23  ALKPHOS 105  BILITOT 0.2*  PROT 7.0  ALBUMIN 3.8    Lab 12/17/11 0545 12/15/11 1800  LIPASE 58 100*  AMYLASE -- --   No results found for this basename: AMMONIA:5 in the last 168 hours CBC:  Lab 12/17/11 0545 12/15/11 1800  WBC 4.7 6.9  NEUTROABS -- 5.3  HGB 13.3 13.8  HCT 40.3 40.1  MCV 103.9* 100.5*  PLT 277 330   Cardiac Enzymes:  Lab 12/15/11 1800  CKTOTAL --  CKMB --  CKMBINDEX --  TROPONINI <0.30   BNP: BNP (last 3 results) No results found for this basename: PROBNP:3 in the last 8760 hours CBG: No results found for this basename: GLUCAP:5 in the last 168 hours  Time coordinating discharge: 40 minutes  Signed:  Leira Regino A  [facility] 12/17/2011, 9:46 AM

## 2011-12-17 NOTE — Progress Notes (Signed)
After pt ambulated to bathroom, pt c/o severe abd pain and very anxious.  Pt aware that tests are negative, but still upset about abdominal pain.  Reassured patient and provided education about possible causes for abdominal pain and current plan of care.  Pt stated "After hearing what you said, that has helped more than a pain shot."  Pt appeared more relaxed and said pain had decreased.  Will continue to monitor.

## 2012-05-01 ENCOUNTER — Encounter (HOSPITAL_COMMUNITY): Payer: Self-pay | Admitting: Emergency Medicine

## 2012-05-01 ENCOUNTER — Emergency Department (HOSPITAL_COMMUNITY)
Admission: EM | Admit: 2012-05-01 | Discharge: 2012-05-02 | Disposition: A | Discharge status: Home / Self Care | Payer: Medicare Other | Attending: Emergency Medicine | Admitting: Emergency Medicine

## 2012-05-01 DIAGNOSIS — G4700 Insomnia, unspecified: Secondary | ICD-10-CM | POA: Insufficient documentation

## 2012-05-01 DIAGNOSIS — Z8659 Personal history of other mental and behavioral disorders: Secondary | ICD-10-CM | POA: Insufficient documentation

## 2012-05-01 DIAGNOSIS — W010XXA Fall on same level from slipping, tripping and stumbling without subsequent striking against object, initial encounter: Secondary | ICD-10-CM | POA: Insufficient documentation

## 2012-05-01 DIAGNOSIS — F29 Unspecified psychosis not due to a substance or known physiological condition: Secondary | ICD-10-CM | POA: Insufficient documentation

## 2012-05-01 DIAGNOSIS — W2203XA Walked into furniture, initial encounter: Secondary | ICD-10-CM | POA: Insufficient documentation

## 2012-05-01 DIAGNOSIS — R4182 Altered mental status, unspecified: Secondary | ICD-10-CM | POA: Insufficient documentation

## 2012-05-01 DIAGNOSIS — S0003XA Contusion of scalp, initial encounter: Secondary | ICD-10-CM | POA: Insufficient documentation

## 2012-05-01 DIAGNOSIS — R5381 Other malaise: Secondary | ICD-10-CM | POA: Insufficient documentation

## 2012-05-01 DIAGNOSIS — M542 Cervicalgia: Secondary | ICD-10-CM | POA: Insufficient documentation

## 2012-05-01 DIAGNOSIS — F3289 Other specified depressive episodes: Secondary | ICD-10-CM | POA: Insufficient documentation

## 2012-05-01 DIAGNOSIS — S0083XA Contusion of other part of head, initial encounter: Secondary | ICD-10-CM | POA: Insufficient documentation

## 2012-05-01 DIAGNOSIS — Y939 Activity, unspecified: Secondary | ICD-10-CM | POA: Insufficient documentation

## 2012-05-01 DIAGNOSIS — W19XXXA Unspecified fall, initial encounter: Secondary | ICD-10-CM

## 2012-05-01 DIAGNOSIS — Z87891 Personal history of nicotine dependence: Secondary | ICD-10-CM | POA: Insufficient documentation

## 2012-05-01 DIAGNOSIS — F1920 Other psychoactive substance dependence, uncomplicated: Secondary | ICD-10-CM | POA: Insufficient documentation

## 2012-05-01 DIAGNOSIS — F411 Generalized anxiety disorder: Secondary | ICD-10-CM | POA: Insufficient documentation

## 2012-05-01 DIAGNOSIS — R109 Unspecified abdominal pain: Secondary | ICD-10-CM | POA: Insufficient documentation

## 2012-05-01 DIAGNOSIS — R5383 Other fatigue: Secondary | ICD-10-CM | POA: Insufficient documentation

## 2012-05-01 DIAGNOSIS — Z79899 Other long term (current) drug therapy: Secondary | ICD-10-CM | POA: Insufficient documentation

## 2012-05-01 DIAGNOSIS — S0510XA Contusion of eyeball and orbital tissues, unspecified eye, initial encounter: Secondary | ICD-10-CM | POA: Insufficient documentation

## 2012-05-01 DIAGNOSIS — F329 Major depressive disorder, single episode, unspecified: Secondary | ICD-10-CM | POA: Insufficient documentation

## 2012-05-01 DIAGNOSIS — Y929 Unspecified place or not applicable: Secondary | ICD-10-CM | POA: Insufficient documentation

## 2012-05-01 DIAGNOSIS — G8929 Other chronic pain: Secondary | ICD-10-CM | POA: Insufficient documentation

## 2012-05-01 LAB — COMPREHENSIVE METABOLIC PANEL
ALT: 21 U/L (ref 0–53)
AST: 28 U/L (ref 0–37)
CO2: 23 mEq/L (ref 19–32)
Chloride: 102 mEq/L (ref 96–112)
GFR calc non Af Amer: 81 mL/min — ABNORMAL LOW (ref >90)
Glucose, Bld: 114 mg/dL — ABNORMAL HIGH (ref 70–99)
Sodium: 138 mEq/L (ref 135–145)
Total Bilirubin: 0.3 mg/dL (ref 0.3–1.2)

## 2012-05-01 LAB — CBC WITH DIFFERENTIAL/PLATELET
Basophils Absolute: 0 10*3/uL (ref 0.0–0.1)
HCT: 43.4 % (ref 39.0–52.0)
Lymphocytes Relative: 54 % — ABNORMAL HIGH (ref 12–46)
Lymphs Abs: 2.5 10*3/uL (ref 0.7–4.0)
Monocytes Absolute: 0.4 10*3/uL (ref 0.1–1.0)
Neutro Abs: 1.7 10*3/uL (ref 1.7–7.7)
Platelets: 213 10*3/uL (ref 150–400)
RBC: 4.49 MIL/uL (ref 4.22–5.81)
RDW: 14.1 % (ref 11.5–15.5)
WBC: 4.7 10*3/uL (ref 4.0–10.5)

## 2012-05-01 MED ORDER — SODIUM CHLORIDE 0.9 % IV BOLUS (SEPSIS)
1000.0000 mL | Freq: Once | INTRAVENOUS | Status: AC
  Administered 2012-05-02: 1000 mL via INTRAVENOUS

## 2012-05-01 NOTE — ED Provider Notes (Signed)
History     CSN: [id number]  Arrival date & time 05/01/12  2123   First MD Initiated Contact with Patient 05/01/12 2255      Chief Complaint  Patient presents with  . Abdominal Pain  . Fatigue    (Consider location/radiation/quality/duration/timing/severity/associated sxs/prior treatment) Patient is a 47 y.o. male presenting with altered mental status. The history is provided by the patient.  Altered Mental Status This is a new problem. The current episode started in the past 7 days. Associated symptoms include abdominal pain and neck pain. Pertinent negatives include no chills, fever, nausea or vomiting. Associated symptoms comments: He fell 5 days ago after tripping on a carpet, landing on right side. His pain is to hip, though he has been ambulating all week; abdomen, without N, V, change in bowel habit; right chest without shortness of breath, cough or fever; and head with facial bruising around his right eye. Per his wife, his mental status has been altered all week - slurring speech, less able to concentrate, slow to respond. No N, V. He is taking Percocet chronically for abdominal pain and he admits to taking Soma as well. He denies that his mental status changes are a result of taking more of his medications than prescribed. He also states he is out of his Percocet. .    Past Medical History  Diagnosis Date  . Anxiety   . Depression   . Insomnia   . Neck pain   . PTSD (post-traumatic stress disorder)   . Chronic abdominal pain     Past Surgical History  Procedure Date  . Cholecystectomy   . Colonoscopy     Family History  Problem Relation Age of Onset  . Diabetes Mother     History  Substance Use Topics  . Smoking status: Former Smoker    Types: Cigars  . Smokeless tobacco: Not on file  . Alcohol Use: Yes      Review of Systems  Constitutional: Negative for fever and chills.  HENT: Positive for neck pain.        C/O chronic neck pain that is unchanged.    Respiratory: Negative.  Negative for shortness of breath.   Cardiovascular: Negative.   Gastrointestinal: Positive for abdominal pain. Negative for nausea and vomiting.       C/O chronic abdominal pain.  Genitourinary: Negative for dysuria.  Skin: Negative.   Neurological: Negative.   Hematological: Does not bruise/bleed easily.  Psychiatric/Behavioral: Positive for confusion, decreased concentration and altered mental status.    Allergies  Antihistamines, chlorpheniramine-type  Home Medications   Current Outpatient Rx  Name  Route  Sig  Dispense  Refill  . ALLOPURINOL 300 MG PO TABS   Oral   Take 300 mg by mouth daily.         Marland Kitchen ALPRAZOLAM 1 MG PO TABS   Oral   Take 1 mg by mouth 3 (three) times daily as needed. For anxiety         . CLONAZEPAM 1 MG PO TABS   Oral   Take 1 mg by mouth at bedtime. sleep         . COLCHICINE 0.6 MG PO TABS   Oral   Take 0.6 mg by mouth 2 (two) times daily.         Marland Kitchen ESOMEPRAZOLE MAGNESIUM 40 MG PO CPDR   Oral   Take 1 capsule (40 mg total) by mouth daily before breakfast.   30 capsule   0   .  FAMOTIDINE 20 MG PO TABS   Oral   Take 1 tablet (20 mg total) by mouth 2 (two) times daily.   30 tablet   0   . MELOXICAM 15 MG PO TABS   Oral   Take 1 tablet (15 mg total) by mouth daily.   10 tablet   0   . QUETIAPINE FUMARATE ER 200 MG PO TB24   Oral   Take 200 mg by mouth at bedtime.         . VENLAFAXINE HCL ER 150 MG PO CP24   Oral   Take 150 mg by mouth every morning. Patient is supposed to use 75 milligrams of this medication.         Marland Kitchen ZOLPIDEM TARTRATE ER 12.5 MG PO TBCR   Oral   Take 12.5 mg by mouth at bedtime as needed. sleep           BP 120/87  Pulse 120  Temp 98 F (36.7 C) (Oral)  Resp 20  SpO2 96%  Physical Exam  Constitutional: He is oriented to person, place, and time. He appears well-developed and well-nourished.  HENT:  Head: Normocephalic.       Healing facial bruise around right  eye. No swelling or biny facial tenderness.   Eyes: EOM are normal. Pupils are equal, round, and reactive to light.  Neck: Normal range of motion.  Cardiovascular: Normal rate and regular rhythm.   No murmur heard. Pulmonary/Chest: Effort normal and breath sounds normal. He has no wheezes. He has no rales.  Abdominal: Soft. There is tenderness.       RUQ and RLQ tenderness without mass, guarding or rebound. SB positive.   Musculoskeletal: Normal range of motion. He exhibits no edema.  Neurological: He is alert and oriented to person, place, and time. He has normal strength and normal reflexes. No sensory deficit. He displays a negative Romberg sign.       Speech is slowed but coherent and oriented. Mildly somnolent, poor focus.   Skin: Skin is warm and dry.    ED Course  Procedures (including critical care time)  Labs Reviewed  CBC WITH DIFFERENTIAL - Abnormal; Notable for the following:    MCH 34.5 (*)     Neutrophils Relative 35 (*)     Lymphocytes Relative 54 (*)     All other components within normal limits  COMPREHENSIVE METABOLIC PANEL - Abnormal; Notable for the following:    Glucose, Bld 114 (*)     GFR calc non Af Amer 81 (*)     All other components within normal limits  LIPASE, BLOOD  POCT I-STAT TROPONIN I  URINALYSIS, ROUTINE W REFLEX MICROSCOPIC  ETHANOL   No results found.   No diagnosis found. 1. Fall 2. Altered mental status 3. Narcotic dependence and chronic pain   MDM  CT scan of head and neck negative. Felt low degree of suspicion for head bleed 5 days from the date of injury/fall, however, there was concern for persistent altered mental status by wife. The patient can be discharged home to follow up with PCP for recheck and further evaluation and management.         Arnoldo Hooker, PA-C 05/02/12 0134

## 2012-05-01 NOTE — ED Notes (Addendum)
Patient complaining of multiple complaints -- abdominal pain, fatigue, generalized body pain that started four days ago.  Patient has bruise around right eye that occurred four days ago after patient had door hit his face.  Denies weakness, dizziness, chest pain, and shortness of breath.  States that he tried to go to his doctors office this week, but his doctor is out of town.

## 2012-05-02 ENCOUNTER — Emergency Department (HOSPITAL_COMMUNITY): Payer: Medicare Other

## 2012-05-02 LAB — URINALYSIS, ROUTINE W REFLEX MICROSCOPIC
Bilirubin Urine: NEGATIVE
Glucose, UA: NEGATIVE mg/dL
Hgb urine dipstick: NEGATIVE
Ketones, ur: NEGATIVE mg/dL
Protein, ur: NEGATIVE mg/dL

## 2012-05-02 MED ORDER — OXYCODONE-ACETAMINOPHEN 10-325 MG PO TABS: 1.0000 | ORAL_TABLET | Freq: Four times a day (QID) | ORAL | Status: DC | PRN

## 2012-05-02 MED ORDER — SODIUM CHLORIDE 0.9 % IV BOLUS (SEPSIS)
1000.0000 mL | Freq: Once | INTRAVENOUS | Status: AC
  Administered 2012-05-02: 1000 mL via INTRAVENOUS

## 2012-05-02 MED ORDER — HYDROMORPHONE HCL PF 1 MG/ML IJ SOLN
1.0000 mg | Freq: Once | INTRAMUSCULAR | Status: AC
  Administered 2012-05-02: 1 mg via INTRAVENOUS
  Filled 2012-05-02: qty 1

## 2012-05-02 MED ORDER — FENTANYL CITRATE 0.05 MG/ML IJ SOLN
100.0000 ug | Freq: Once | INTRAMUSCULAR | Status: AC
  Administered 2012-05-02: 100 ug via INTRAVENOUS
  Filled 2012-05-02: qty 2

## 2012-05-02 NOTE — ED Provider Notes (Signed)
Medical screening examination/treatment/procedure(s) were performed by non-physician practitioner and as supervising physician I was immediately available for consultation/collaboration. Devoria Albe, MD, Armando Gang   Ward Givens, MD 05/02/12 [phone]

## 2012-08-26 ENCOUNTER — Encounter (HOSPITAL_COMMUNITY): Payer: Self-pay | Admitting: *Deleted

## 2012-08-26 ENCOUNTER — Telehealth (HOSPITAL_COMMUNITY): Payer: Self-pay | Admitting: Emergency Medicine

## 2012-08-26 ENCOUNTER — Emergency Department (HOSPITAL_COMMUNITY)
Admission: EM | Admit: 2012-08-26 | Discharge: 2012-08-26 | Disposition: A | Discharge status: Home / Self Care | Payer: Medicare Other | Attending: Emergency Medicine | Admitting: Emergency Medicine

## 2012-08-26 DIAGNOSIS — Y939 Activity, unspecified: Secondary | ICD-10-CM | POA: Insufficient documentation

## 2012-08-26 DIAGNOSIS — Z87891 Personal history of nicotine dependence: Secondary | ICD-10-CM | POA: Insufficient documentation

## 2012-08-26 DIAGNOSIS — X58XXXA Exposure to other specified factors, initial encounter: Secondary | ICD-10-CM | POA: Insufficient documentation

## 2012-08-26 DIAGNOSIS — F329 Major depressive disorder, single episode, unspecified: Secondary | ICD-10-CM | POA: Insufficient documentation

## 2012-08-26 DIAGNOSIS — Z79899 Other long term (current) drug therapy: Secondary | ICD-10-CM | POA: Insufficient documentation

## 2012-08-26 DIAGNOSIS — G8929 Other chronic pain: Secondary | ICD-10-CM | POA: Insufficient documentation

## 2012-08-26 DIAGNOSIS — T148XXA Other injury of unspecified body region, initial encounter: Secondary | ICD-10-CM

## 2012-08-26 DIAGNOSIS — F4310 Post-traumatic stress disorder, unspecified: Secondary | ICD-10-CM | POA: Insufficient documentation

## 2012-08-26 DIAGNOSIS — M79609 Pain in unspecified limb: Secondary | ICD-10-CM

## 2012-08-26 DIAGNOSIS — G4700 Insomnia, unspecified: Secondary | ICD-10-CM | POA: Insufficient documentation

## 2012-08-26 DIAGNOSIS — IMO0002 Reserved for concepts with insufficient information to code with codable children: Secondary | ICD-10-CM | POA: Insufficient documentation

## 2012-08-26 DIAGNOSIS — F411 Generalized anxiety disorder: Secondary | ICD-10-CM | POA: Insufficient documentation

## 2012-08-26 DIAGNOSIS — M542 Cervicalgia: Secondary | ICD-10-CM | POA: Insufficient documentation

## 2012-08-26 DIAGNOSIS — F3289 Other specified depressive episodes: Secondary | ICD-10-CM | POA: Insufficient documentation

## 2012-08-26 DIAGNOSIS — Y929 Unspecified place or not applicable: Secondary | ICD-10-CM | POA: Insufficient documentation

## 2012-08-26 MED ORDER — MELOXICAM 7.5 MG PO TABS: 15.0000 mg | ORAL_TABLET | Freq: Every day | ORAL | Status: DC

## 2012-08-26 MED ORDER — HYDROMORPHONE HCL PF 1 MG/ML IJ SOLN
1.0000 mg | Freq: Once | INTRAMUSCULAR | Status: AC
  Administered 2012-08-26: 1 mg via INTRAMUSCULAR
  Filled 2012-08-26: qty 1

## 2012-08-26 MED ORDER — HYDROMORPHONE HCL PF 2 MG/ML IJ SOLN
2.0000 mg | Freq: Once | INTRAMUSCULAR | Status: AC
  Administered 2012-08-26: 2 mg via INTRAMUSCULAR
  Filled 2012-08-26: qty 1

## 2012-08-26 NOTE — ED Notes (Signed)
Pt ambulatory upon arrival leaving with paperwork. Pt states wife coming to get him from ED.

## 2012-08-26 NOTE — ED Provider Notes (Signed)
History     CSN: [id number]  Arrival date & time 08/26/12  1125   First MD Initiated Contact with Patient 08/26/12 1200      Chief Complaint  Patient presents with  . Groin Pain    (Consider location/radiation/quality/duration/timing/severity/associated sxs/prior treatment) HPI Comments: Pt is a pleasant 47 y/o male with hx of some chronic pain in his neck who presents with 1 week of R proximal thigh and groin pain - is constant, worse with movement of the leg and associated with severe pain with movement.  No numbness or weakness.  He does Bettey Costa and states this makes it much worse.  He has no testicular pain or swelling.  No risk factors for DVT  Patient is a 47 y.o. male presenting with groin pain. The history is provided by the patient.  Groin Pain    Past Medical History  Diagnosis Date  . Anxiety   . Depression   . Insomnia   . Neck pain   . PTSD (post-traumatic stress disorder)   . Chronic abdominal pain     Past Surgical History  Procedure Laterality Date  . Cholecystectomy    . Colonoscopy      Family History  Problem Relation Age of Onset  . Diabetes Mother     History  Substance Use Topics  . Smoking status: Former Smoker    Types: Cigars  . Smokeless tobacco: Not on file  . Alcohol Use: Yes      Review of Systems  All other systems reviewed and are negative.    Allergies  Antihistamines, chlorpheniramine-type and Prednisone  Home Medications   Current Outpatient Rx  Name  Route  Sig  Dispense  Refill  . ALPRAZolam (XANAX) 1 MG tablet   Oral   Take 1 mg by mouth 3 (three) times daily as needed. For anxiety         . clonazePAM (KLONOPIN) 1 MG tablet   Oral   Take 1 mg by mouth at bedtime. sleep         . esomeprazole (NEXIUM) 40 MG capsule   Oral   Take 1 capsule (40 mg total) by mouth daily before breakfast.   30 capsule   0   . folic acid (FOLVITE) 1 MG tablet   Oral   Take 1 mg by mouth daily.         Marland Kitchen  oxyCODONE-acetaminophen (PERCOCET) 10-325 MG per tablet   Oral   Take 1 tablet by mouth every 6 (six) hours as needed. For pain   12 tablet   0   . QUEtiapine (SEROQUEL XR) 200 MG 24 hr tablet   Oral   Take 200 mg by mouth at bedtime.         . Vitamin D, Ergocalciferol, (DRISDOL) 50000 UNITS CAPS   Oral   Take 50,000 Units by mouth every 7 (seven) days. Takes on Monday         . zolpidem (AMBIEN CR) 12.5 MG CR tablet   Oral   Take 12.5 mg by mouth at bedtime as needed. sleep         . meloxicam (MOBIC) 7.5 MG tablet   Oral   Take 2 tablets (15 mg total) by mouth daily.   30 tablet   0     BP 133/85  Pulse 70  Temp(Src) 98.2 F (36.8 C) (Oral)  Resp 20  SpO2 97%  Physical Exam  Nursing note and vitals reviewed. Constitutional: He appears  well-developed and well-nourished. No distress.  HENT:  Head: Normocephalic and atraumatic.  Mouth/Throat: Oropharynx is clear and moist. No oropharyngeal exudate.  Eyes: Conjunctivae and EOM are normal. Pupils are equal, round, and reactive to light. Right eye exhibits no discharge. Left eye exhibits no discharge. No scleral icterus.  Neck: Normal range of motion. Neck supple. No JVD present. No thyromegaly present.  Cardiovascular: Normal rate, regular rhythm, normal heart sounds and intact distal pulses.  Exam reveals no gallop and no friction rub.   No murmur heard. Pulmonary/Chest: Effort normal and breath sounds normal. No respiratory distress. He has no wheezes. He has no rales.  Abdominal: Soft. Bowel sounds are normal. He exhibits no distension and no mass. There is no tenderness.  Genitourinary:  Normal appearing penis and scrotum, no hernias, no tenderness, no discharge  Musculoskeletal: Normal range of motion. He exhibits tenderness ( ttp in the R proximal thigh / lower than the inguinal ligament.  no swelling ). He exhibits no edema.  No swelling or asymmetry of the lower extremities  Lymphadenopathy:    He has no  cervical adenopathy.  Neurological: He is alert. Coordination normal.  Skin: Skin is warm and dry. No rash noted. No erythema.  Psychiatric: He has a normal mood and affect. His behavior is normal.    ED Course  Procedures (including critical care time)  Labs Reviewed - No data to display No results found.   1. Muscle strain       MDM  Well-appearing, tenderness is reproduced with range of motion, internal rotation and flexion at the hip. Vital signs are normal, doubt DVT, patient is insistent on being examined for this, ultrasound ordered, pain medication ordered by parenteral route. Suspect muscle strain or tendinitis of the proximal hip flexors  [place] neg for acute DVT, VS stable - pt continues to ask for narcotic medications - I have informed him that he will get NO narcotics for home - he is already taking Perc 10/325 - will add NSAIDs.  Meds given in ED:  Medications  HYDROmorphone (DILAUDID) injection 1 mg (1 mg Intramuscular Given 08/26/12 1224)  HYDROmorphone (DILAUDID) injection 2 mg (2 mg Intramuscular Given 08/26/12 1357)    New Prescriptions   MELOXICAM (MOBIC) 7.5 MG TABLET    Take 2 tablets (15 mg total) by mouth daily.          Vida Roller, MD 08/26/12 1535

## 2012-08-26 NOTE — ED Notes (Signed)
Pt alert and mentating appropriately upon d/c. Pt verbalizes understanding of d/c teaching and prescriptions. Pt has no further questions upon d/c. Pt instructed not to drive home and states he has a ride coming to get him from the lobby. NAD noted upon d/c. Pt ambulatory upon d/c.

## 2012-08-26 NOTE — Progress Notes (Signed)
VASCULAR LAB PRELIMINARY  PRELIMINARY  PRELIMINARY  PRELIMINARY  Right lower extremity venous Doppler completed.    Preliminary report:  There is no DVT or SVT noted in the right lower extremity.  [name], RVT 08/26/2012, 3:13 PM

## 2012-08-26 NOTE — ED Notes (Signed)
Pt ambulatory.

## 2012-08-26 NOTE — Telephone Encounter (Signed)
Pt called to find out what medications he received while in the ed today.

## 2012-08-26 NOTE — ED Notes (Signed)
Explained to pt about delay in additional pain medication being given and explained that Dr Hyacinth Meeker was aware of situation. Explained to pt that he is waiting to go for vascular study.

## 2012-08-26 NOTE — ED Notes (Signed)
Pt transported to vascular.  °

## 2012-08-26 NOTE — ED Notes (Signed)
Pt reports (R) groin pain x 1 week.  Reports ankle swelling.  Reports that the pain is constant.

## 2012-09-15 IMAGING — CT CT ABD-PELV W/O CM
2 of 4 series · 17 of 46 positions shown, 19 images · non-contrast
Comparison: 05/12/2011

CLINICAL DATA: Low back pain.  Right lower quadrant pain.  Nausea.

CT ABDOMEN AND PELVIS WITHOUT CONTRAST
TECHNIQUE: Multidetector CT imaging of the abdomen and pelvis was
performed following the standard protocol without intravenous
contrast.

[Series 2: stone >200 lbs 5.0 b31f st · axial · 0.87mm/px · z∈[-558,-118]mm · 14 of 97 slices shown, 16 images]
[im 5/97  soft-tissue]
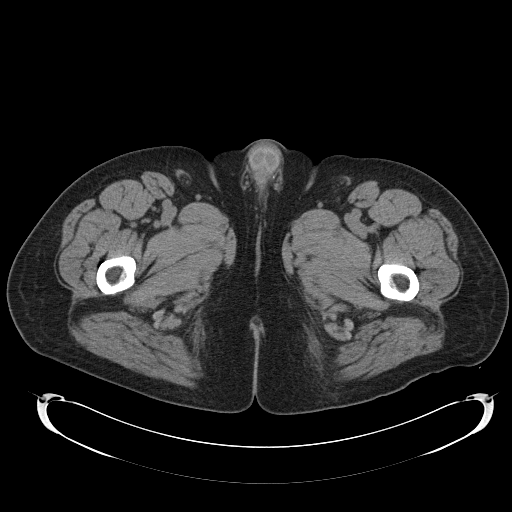
[im 5/97  bone]
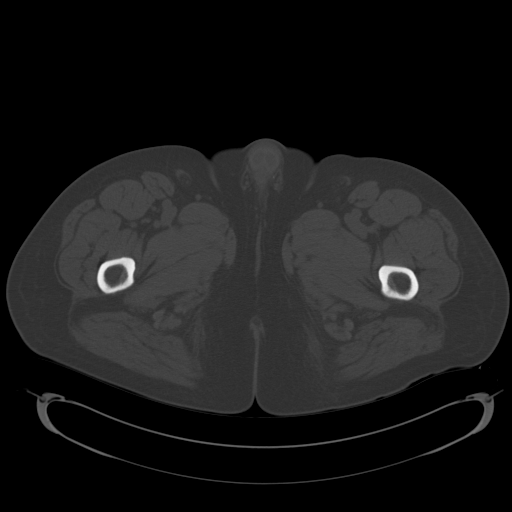
[im 13/97  soft-tissue]
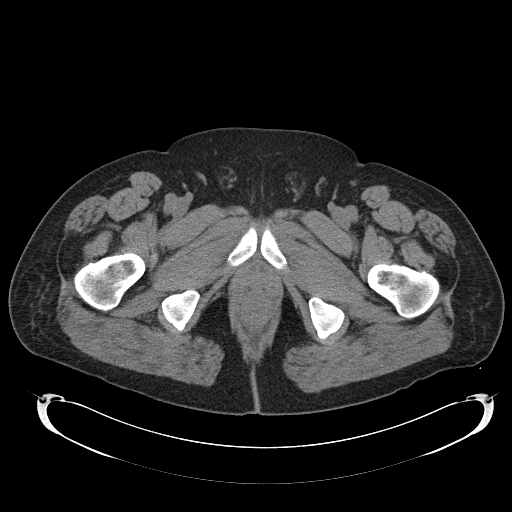
[im 21/97  soft-tissue]
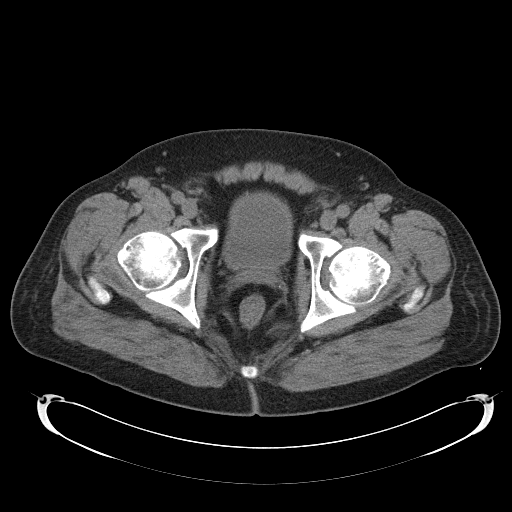
[im 25/97  soft-tissue]
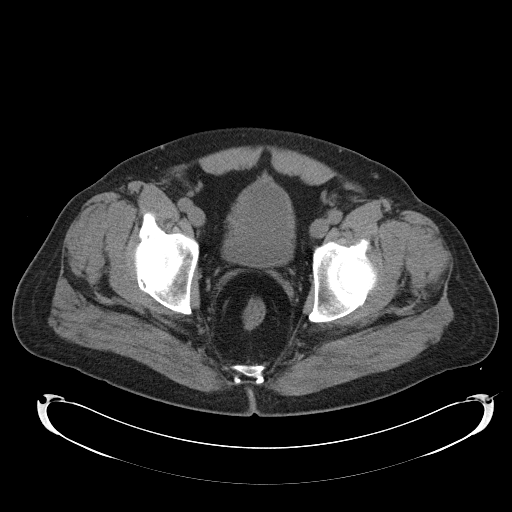
[im 33/97  soft-tissue]
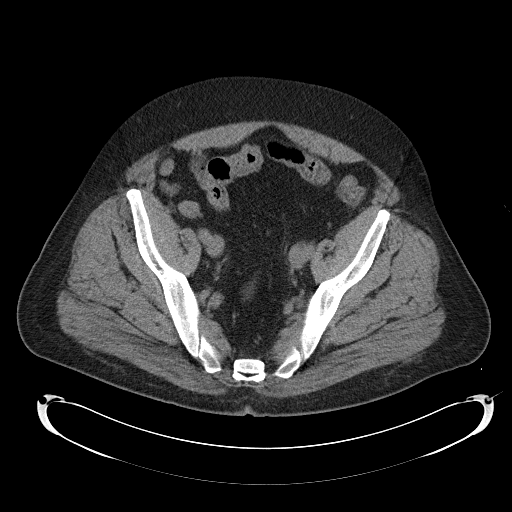
[im 41/97  soft-tissue]
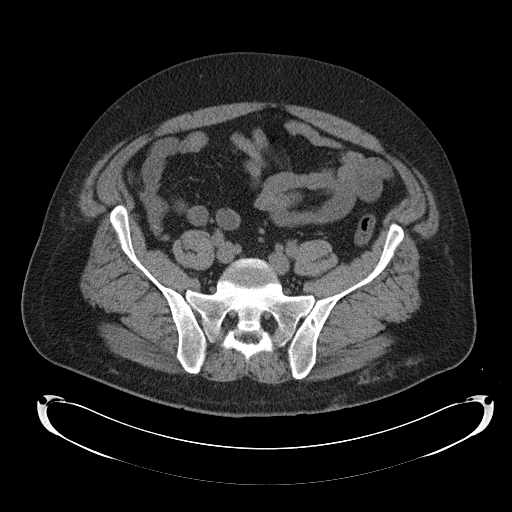
[im 45/97  soft-tissue]
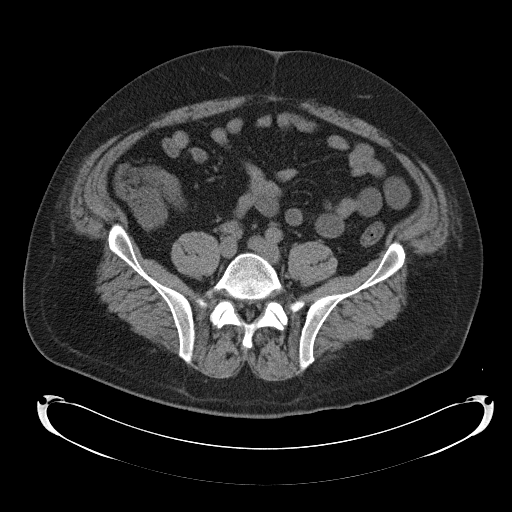
[im 53/97  soft-tissue]
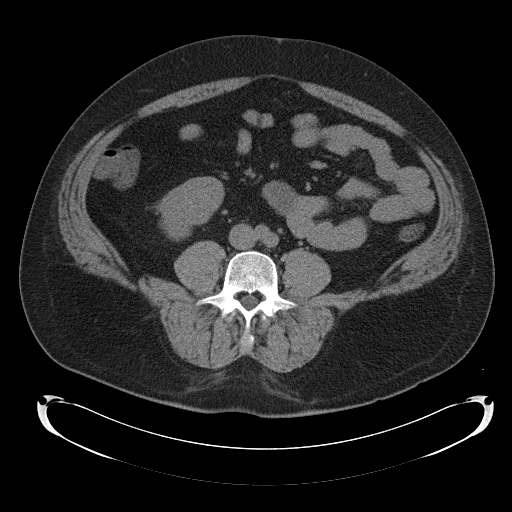
[im 57/97  soft-tissue]
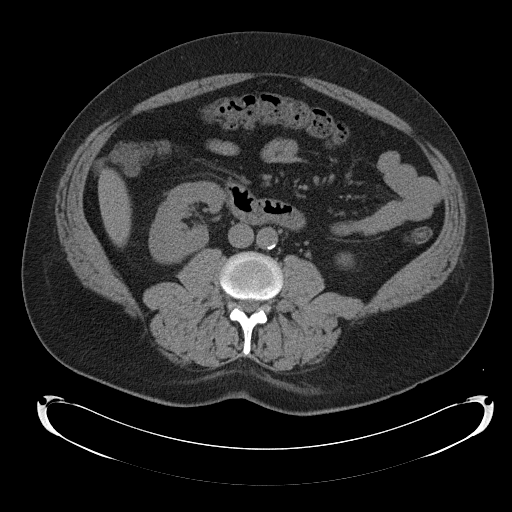
[im 57/97  bone]
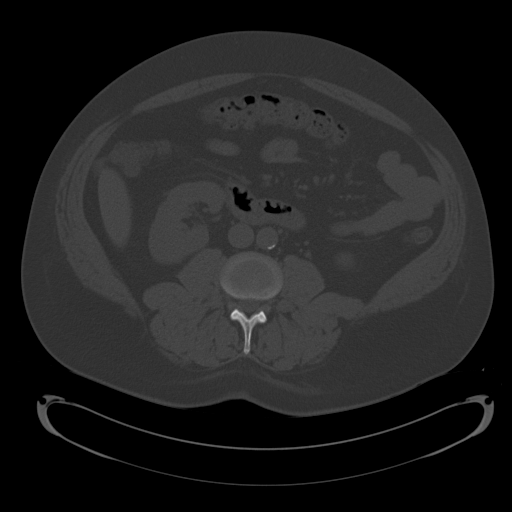
[im 65/97  soft-tissue]
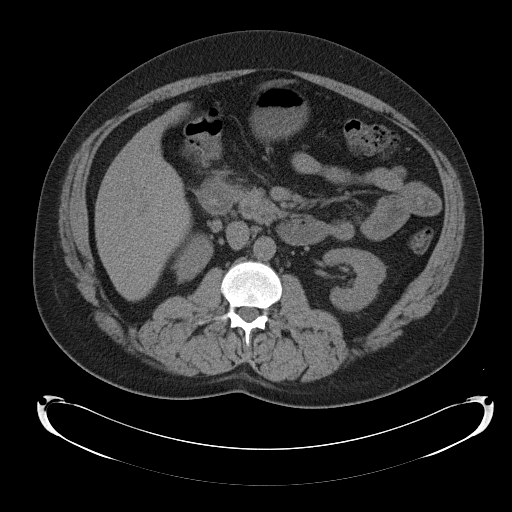
[im 73/97  soft-tissue]
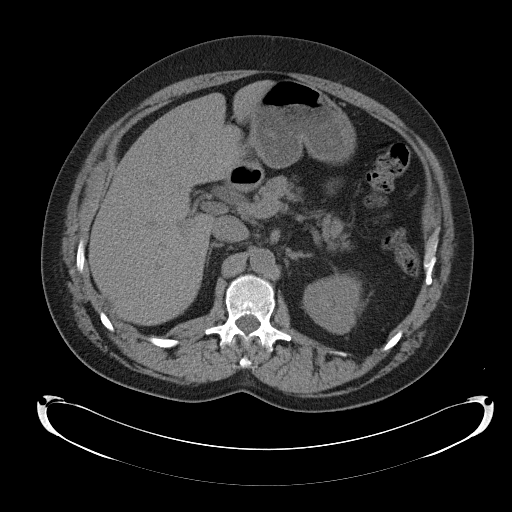
[im 77/97  soft-tissue]
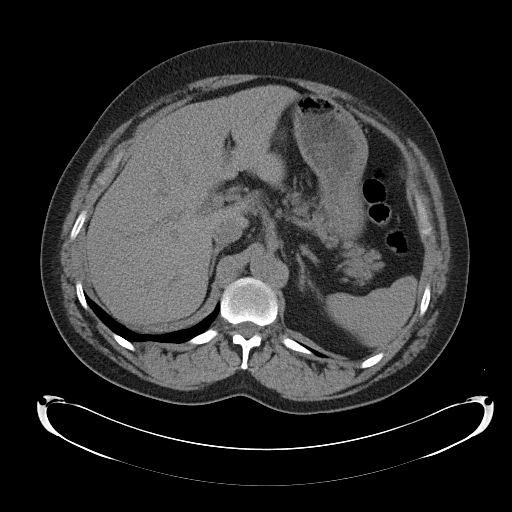
[im 85/97  soft-tissue]
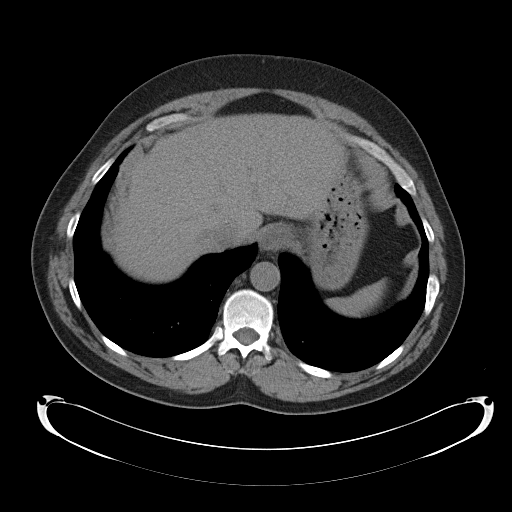
[im 93/97  soft-tissue]
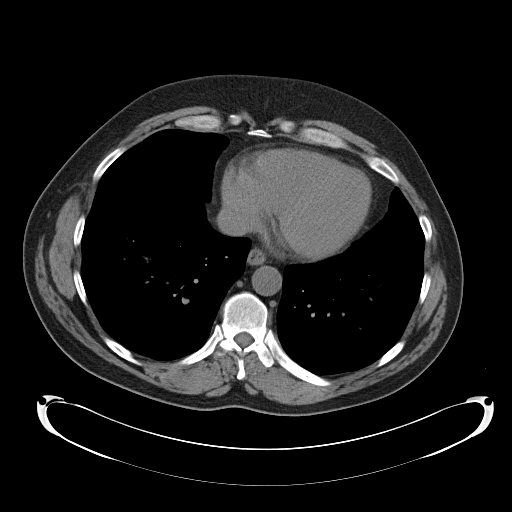

[Series 5: coronals cor · coronal · 0.94mm/px · 3 of 99 slices shown]
[im 33/99  soft-tissue]
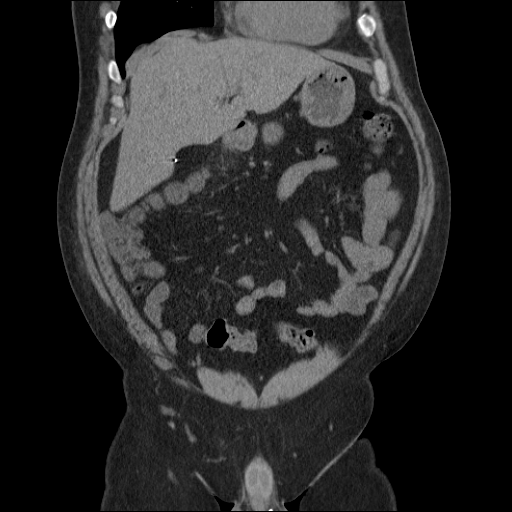
[im 44/99  soft-tissue]
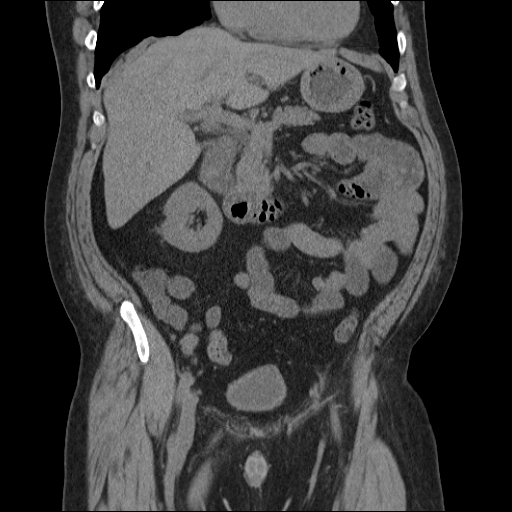
[im 55/99  soft-tissue]
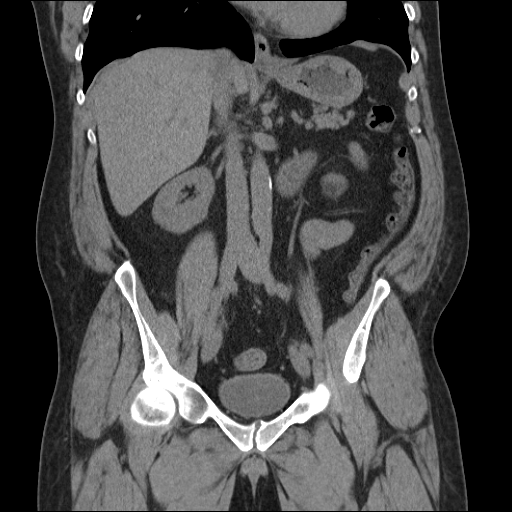

[17 of 46 positions shown; findings below may reference images not displayed]

FINDINGS: The lung bases are clear.

The kidneys appear symmetrical in size and shape.  No
pyelocaliectasis or ureterectasis.  No renal, ureteral, or bladder
stones visualized.  Bladder wall is not thickened.  Calcified
phleboliths in the pelvis.

Surgical absence of the gallbladder.  The unenhanced appearance of
the liver, spleen, pancreas, adrenal glands, abdominal aorta, and
retroperitoneal lymph nodes is unremarkable.  There is a small
umbilical hernia containing fat.  This is stable.  There is
suggestion of mild infiltration around the duodenal bulb with some
wall thickening.  There appears to be a duodenal diverticulum.
Duodenitis or ulcer disease is not excluded.  The stomach is not
abnormally distended.  No small bowel distension.  Stool filled
colon without distension.  No free air or free fluid in the
abdomen.

Pelvis:  There is calcification in the base of the appendix
suggesting appendicolith but the appendix otherwise normal.  No
evidence of appendicitis.  Bladder wall is not thickened.  Prostate
gland is not enlarged. Calcifications are present in the prostate
gland. These are stable.  No free or loculated pelvic fluid
collections.  No diverticulitis.  No significant pelvic
lymphadenopathy.  Normal alignment of the lumbar vertebrae.
IMPRESSION: No renal or ureteral stone or obstruction.  Suggestion of
inflammatory process around the duodenal bulb which could represent
duodenitis or ulcer disease.  Clinical correlation recommended.
Umbilical hernia containing fat.  Appendicolith without
appendicitis.

## 2012-09-23 ENCOUNTER — Encounter (HOSPITAL_COMMUNITY): Payer: Self-pay | Admitting: Adult Health

## 2012-09-23 DIAGNOSIS — F411 Generalized anxiety disorder: Secondary | ICD-10-CM | POA: Insufficient documentation

## 2012-09-23 LAB — CBC
MCH: 34 pg (ref 26.0–34.0)
MCHC: 35.4 g/dL (ref 30.0–36.0)
MCV: 96.2 fL (ref 78.0–100.0)
Platelets: 170 10*3/uL (ref 150–400)
RDW: 14.2 % (ref 11.5–15.5)

## 2012-09-23 LAB — BASIC METABOLIC PANEL
Calcium: 8.7 mg/dL (ref 8.4–10.5)
Creatinine, Ser: 0.89 mg/dL (ref 0.50–1.35)
GFR calc Af Amer: >90 mL/min (ref >90)
GFR calc non Af Amer: >90 mL/min (ref >90)

## 2012-09-23 NOTE — ED Notes (Addendum)
Pt states, "I have this sense of something is not right. I feel like I am poisoned or something. I think something is in my blood. My hip has been causing me problems. The percocet are not working. MY feet are swelling up. I just don't feel right. Do you think you can get me out of pain. I just wish someone would keep an eye on me for a while I want to make sure there is nothing wrong inside of me and make sure that my swelling feet are not messing up my heart"  Pt had a skin biopsy this am on skin rash.  Pt is anxious. Alert and oriented and MAEx4.  Pt denies chest pain. Denies SOB.

## 2012-09-24 ENCOUNTER — Emergency Department (HOSPITAL_COMMUNITY)
Admission: EM | Admit: 2012-09-24 | Discharge: 2012-09-24 | Discharge status: Against Medical Advice | Payer: Medicare Other | Attending: Emergency Medicine | Admitting: Emergency Medicine

## 2012-10-27 ENCOUNTER — Other Ambulatory Visit (HOSPITAL_COMMUNITY): Payer: Self-pay | Admitting: Orthopaedic Surgery

## 2012-11-09 ENCOUNTER — Encounter (HOSPITAL_COMMUNITY): Payer: Self-pay | Admitting: Pharmacy Technician

## 2012-11-11 ENCOUNTER — Ambulatory Visit (HOSPITAL_COMMUNITY)
Admission: RE | Admit: 2012-11-11 | Discharge: 2012-11-11 | Disposition: A | Discharge status: Home / Self Care | Payer: Medicare Other | Source: Ambulatory Visit | Attending: Orthopaedic Surgery | Admitting: Orthopaedic Surgery

## 2012-11-11 ENCOUNTER — Encounter (HOSPITAL_COMMUNITY): Payer: Self-pay

## 2012-11-11 ENCOUNTER — Encounter (HOSPITAL_COMMUNITY)
Admission: RE | Admit: 2012-11-11 | Discharge: 2012-11-11 | Disposition: A | Discharge status: Home / Self Care | Payer: Medicare Other | Source: Ambulatory Visit | Attending: Orthopaedic Surgery | Admitting: Orthopaedic Surgery

## 2012-11-11 DIAGNOSIS — Z01812 Encounter for preprocedural laboratory examination: Secondary | ICD-10-CM | POA: Insufficient documentation

## 2012-11-11 DIAGNOSIS — Z01818 Encounter for other preprocedural examination: Secondary | ICD-10-CM | POA: Insufficient documentation

## 2012-11-11 DIAGNOSIS — Z01810 Encounter for preprocedural cardiovascular examination: Secondary | ICD-10-CM | POA: Insufficient documentation

## 2012-11-11 DIAGNOSIS — R000 Tachycardia, unspecified: Secondary | ICD-10-CM | POA: Insufficient documentation

## 2012-11-11 DIAGNOSIS — M87059 Idiopathic aseptic necrosis of unspecified femur: Secondary | ICD-10-CM | POA: Insufficient documentation

## 2012-11-11 HISTORY — DX: Unspecified osteoarthritis, unspecified site: M19.90

## 2012-11-11 HISTORY — DX: Occlusion and stenosis of unspecified carotid artery: I65.29

## 2012-11-11 HISTORY — DX: Sleep apnea, unspecified: G47.30

## 2012-11-11 HISTORY — DX: Tachycardia, unspecified: R00.0

## 2012-11-11 HISTORY — DX: Gastro-esophageal reflux disease without esophagitis: K21.9

## 2012-11-11 LAB — URINALYSIS, ROUTINE W REFLEX MICROSCOPIC
Bilirubin Urine: NEGATIVE
Hgb urine dipstick: NEGATIVE
Specific Gravity, Urine: 1.011 (ref 1.005–1.030)
Urobilinogen, UA: 0.2 mg/dL (ref 0.0–1.0)

## 2012-11-11 LAB — CBC
HCT: 41.9 % (ref 39.0–52.0)
MCV: 99.1 fL (ref 78.0–100.0)
RDW: 13.3 % (ref 11.5–15.5)
WBC: 5.3 10*3/uL (ref 4.0–10.5)

## 2012-11-11 LAB — PROTIME-INR: Prothrombin Time: 12.7 seconds (ref 11.6–15.2)

## 2012-11-11 LAB — SURGICAL PCR SCREEN: Staphylococcus aureus: NEGATIVE

## 2012-11-11 LAB — BASIC METABOLIC PANEL
BUN: 8 mg/dL (ref 6–23)
CO2: 25 mEq/L (ref 19–32)
Chloride: 105 mEq/L (ref 96–112)
Creatinine, Ser: 0.89 mg/dL (ref 0.50–1.35)
Glucose, Bld: 96 mg/dL (ref 70–99)

## 2012-11-11 NOTE — Pre-Procedure Instructions (Signed)
PT'S HEART RATE 108 - PREVIOUS EKG IN EPIC FROM 05/01/12 HEART RATE 140.  PT THINKS HIS HEART RATE ELEVATES WHEN HE IS ANXIOUS - STATES HE IS ANXIOUS ABOUT HIS UP COMING HIP REPLACEMENT SURGERY.  EKG AND CXR WERE DONE TODAY - PREOP - AT [facility].

## 2012-11-11 NOTE — Patient Instructions (Signed)
YOUR SURGERY IS SCHEDULED AT [facility]  ON:  Friday  AUGUST 22  REPORT TO [facility] AT:  5:30 AM      PHONE # FOR SHORT STAY IS [phone]  DO NOT EAT OR DRINK ANYTHING AFTER MIDNIGHT THE NIGHT BEFORE YOUR SURGERY.  YOU MAY BRUSH YOUR TEETH, RINSE OUT YOUR MOUTH--BUT NO WATER, NO FOOD, NO CHEWING GUM, NO MINTS, NO CANDIES, NO CHEWING TOBACCO.  PLEASE TAKE THE FOLLOWING MEDICATIONS THE AM OF YOUR SURGERY WITH A FEW SIPS OF WATER:  ALPRAZOLAM, NEXIUM, OXYCODONE,   USE YOUR ALBUTEROL INHALER.  IF YOU USE INHALERS--USE YOUR INHALERS THE AM OF YOUR SURGERY AND BRING INHALERS TO THE HOSPITAL.      IF YOU HAVE SLEEP APNEA AND USE CPAP OR BIPAP--PLEASE BRING THE MASK AND THE TUBING.  DO NOT BRING THE MACHINE.  DO NOT BRING VALUABLES, MONEY, CREDIT CARDS.  DO NOT WEAR JEWELRY, MAKE-UP, NAIL POLISH AND NO METAL PINS OR CLIPS IN YOUR HAIR. CONTACT LENS, DENTURES / PARTIALS, GLASSES SHOULD NOT BE WORN TO SURGERY AND IN MOST CASES-HEARING AIDS WILL NEED TO BE REMOVED.  BRING YOUR GLASSES CASE, ANY EQUIPMENT NEEDED FOR YOUR CONTACT LENS. FOR PATIENTS ADMITTED TO THE HOSPITAL--CHECK OUT TIME THE DAY OF DISCHARGE IS 11:00 AM.  ALL INPATIENT ROOMS ARE PRIVATE - WITH BATHROOM, TELEPHONE, TELEVISION AND WIFI INTERNET.                                 PLEASE READ OVER ANY  FACT SHEETS THAT YOU WERE GIVEN: MRSA INFORMATION, BLOOD TRANSFUSION INFORMATION FAILURE TO FOLLOW THESE INSTRUCTIONS MAY RESULT IN THE CANCELLATION OF YOUR SURGERY.   PATIENT SIGNATURE_________________________________

## 2012-11-12 ENCOUNTER — Encounter (HOSPITAL_COMMUNITY): Payer: Self-pay

## 2012-11-15 ENCOUNTER — Encounter (HOSPITAL_COMMUNITY): Payer: Self-pay

## 2012-11-17 ENCOUNTER — Encounter (HOSPITAL_COMMUNITY): Payer: Self-pay | Admitting: *Deleted

## 2012-11-19 ENCOUNTER — Encounter (HOSPITAL_COMMUNITY): Payer: Self-pay | Admitting: Anesthesiology

## 2012-11-19 ENCOUNTER — Encounter (HOSPITAL_COMMUNITY): Payer: Self-pay | Admitting: *Deleted

## 2012-11-19 ENCOUNTER — Inpatient Hospital Stay (HOSPITAL_COMMUNITY)
Admission: RE | Admit: 2012-11-19 | Discharge: 2012-11-22 | DRG: 470 | Disposition: A | Discharge status: Skilled Nursing Facility | Payer: Medicare Other | Source: Ambulatory Visit | Attending: Orthopaedic Surgery | Admitting: Orthopaedic Surgery

## 2012-11-19 ENCOUNTER — Encounter (HOSPITAL_COMMUNITY)
Admission: RE | Disposition: A | Discharge status: Skilled Nursing Facility | Payer: Self-pay | Source: Ambulatory Visit | Attending: Orthopaedic Surgery

## 2012-11-19 ENCOUNTER — Inpatient Hospital Stay (HOSPITAL_COMMUNITY): Payer: Medicare Other

## 2012-11-19 ENCOUNTER — Inpatient Hospital Stay (HOSPITAL_COMMUNITY): Payer: Medicare Other | Admitting: Anesthesiology

## 2012-11-19 DIAGNOSIS — F411 Generalized anxiety disorder: Secondary | ICD-10-CM | POA: Diagnosis present

## 2012-11-19 DIAGNOSIS — M87059 Idiopathic aseptic necrosis of unspecified femur: Principal | ICD-10-CM | POA: Diagnosis present

## 2012-11-19 DIAGNOSIS — F17200 Nicotine dependence, unspecified, uncomplicated: Secondary | ICD-10-CM | POA: Diagnosis present

## 2012-11-19 DIAGNOSIS — M169 Osteoarthritis of hip, unspecified: Secondary | ICD-10-CM | POA: Diagnosis present

## 2012-11-19 DIAGNOSIS — M1610 Unilateral primary osteoarthritis, unspecified hip: Secondary | ICD-10-CM | POA: Diagnosis present

## 2012-11-19 DIAGNOSIS — F329 Major depressive disorder, single episode, unspecified: Secondary | ICD-10-CM | POA: Diagnosis present

## 2012-11-19 DIAGNOSIS — K219 Gastro-esophageal reflux disease without esophagitis: Secondary | ICD-10-CM | POA: Diagnosis present

## 2012-11-19 DIAGNOSIS — F3289 Other specified depressive episodes: Secondary | ICD-10-CM | POA: Diagnosis present

## 2012-11-19 DIAGNOSIS — M542 Cervicalgia: Secondary | ICD-10-CM | POA: Diagnosis present

## 2012-11-19 DIAGNOSIS — M87051 Idiopathic aseptic necrosis of right femur: Secondary | ICD-10-CM

## 2012-11-19 HISTORY — PX: TOTAL HIP ARTHROPLASTY: SHX124

## 2012-11-19 LAB — TYPE AND SCREEN
ABO/RH(D): AB NEG
Antibody Screen: NEGATIVE

## 2012-11-19 LAB — ABO/RH: ABO/RH(D): AB NEG

## 2012-11-19 SURGERY — ARTHROPLASTY, HIP, TOTAL, ANTERIOR APPROACH
Anesthesia: General | Site: Hip | Laterality: Right | Wound class: Clean

## 2012-11-19 MED ORDER — ALUM & MAG HYDROXIDE-SIMETH 200-200-20 MG/5ML PO SUSP: 30.0000 mL | ORAL | Status: DC | PRN

## 2012-11-19 MED ORDER — 0.9 % SODIUM CHLORIDE (POUR BTL) OPTIME
TOPICAL | Status: DC | PRN
  Administered 2012-11-19: 1000 mL

## 2012-11-19 MED ORDER — FOLIC ACID 1 MG PO TABS
1.0000 mg | ORAL_TABLET | Freq: Every day | ORAL | Status: DC
  Administered 2012-11-19 – 2012-11-22 (×4): 1 mg via ORAL
  Filled 2012-11-19 (×4): qty 1

## 2012-11-19 MED ORDER — HYDROMORPHONE HCL PF 1 MG/ML IJ SOLN
1.0000 mg | INTRAMUSCULAR | Status: DC | PRN
  Administered 2012-11-19 – 2012-11-21 (×11): 1 mg via INTRAVENOUS
  Filled 2012-11-19 (×11): qty 1

## 2012-11-19 MED ORDER — ONDANSETRON HCL 4 MG/2ML IJ SOLN: 4.0000 mg | Freq: Four times a day (QID) | INTRAMUSCULAR | Status: DC | PRN

## 2012-11-19 MED ORDER — QUETIAPINE FUMARATE ER 200 MG PO TB24: 200.0000 mg | ORAL_TABLET | Freq: Every day | ORAL | Status: DC

## 2012-11-19 MED ORDER — DIPHENHYDRAMINE HCL 12.5 MG/5ML PO ELIX: 12.5000 mg | ORAL_SOLUTION | ORAL | Status: DC | PRN

## 2012-11-19 MED ORDER — CEFAZOLIN SODIUM-DEXTROSE 2-3 GM-% IV SOLR
2.0000 g | INTRAVENOUS | Status: AC
  Administered 2012-11-19: 2 g via INTRAVENOUS

## 2012-11-19 MED ORDER — PANTOPRAZOLE SODIUM 40 MG PO TBEC
80.0000 mg | DELAYED_RELEASE_TABLET | Freq: Every day | ORAL | Status: DC
  Administered 2012-11-19 – 2012-11-22 (×4): 80 mg via ORAL
  Filled 2012-11-19 (×4): qty 2

## 2012-11-19 MED ORDER — VITAMIN D (ERGOCALCIFEROL) 1.25 MG (50000 UNIT) PO CAPS
50000.0000 [IU] | ORAL_CAPSULE | ORAL | Status: DC
  Administered 2012-11-22: 50000 [IU] via ORAL
  Filled 2012-11-19: qty 1

## 2012-11-19 MED ORDER — ONDANSETRON HCL 4 MG/2ML IJ SOLN
INTRAMUSCULAR | Status: DC | PRN
  Administered 2012-11-19: 4 mg via INTRAVENOUS

## 2012-11-19 MED ORDER — PHENOL 1.4 % MT LIQD: 1.0000 | OROMUCOSAL | Status: DC | PRN

## 2012-11-19 MED ORDER — METOCLOPRAMIDE HCL 10 MG PO TABS: 5.0000 mg | ORAL_TABLET | Freq: Three times a day (TID) | ORAL | Status: DC | PRN

## 2012-11-19 MED ORDER — HYDROMORPHONE HCL PF 1 MG/ML IJ SOLN
0.2500 mg | INTRAMUSCULAR | Status: DC | PRN
  Administered 2012-11-19: 0.5 mg via INTRAVENOUS
  Administered 2012-11-19: 0.25 mg via INTRAVENOUS
  Administered 2012-11-19 (×2): 0.5 mg via INTRAVENOUS
  Administered 2012-11-19: 0.25 mg via INTRAVENOUS

## 2012-11-19 MED ORDER — DEXTROSE 5 % IV SOLN
500.0000 mg | Freq: Four times a day (QID) | INTRAVENOUS | Status: DC | PRN
  Administered 2012-11-19: 500 mg via INTRAVENOUS
  Filled 2012-11-19 (×2): qty 5

## 2012-11-19 MED ORDER — LACTATED RINGERS IV SOLN: INTRAVENOUS | Status: DC

## 2012-11-19 MED ORDER — OXYCODONE HCL 5 MG PO TABS
5.0000 mg | ORAL_TABLET | ORAL | Status: DC | PRN
  Administered 2012-11-19 (×2): 10 mg via ORAL
  Filled 2012-11-19 (×2): qty 2

## 2012-11-19 MED ORDER — NICOTINE 21 MG/24HR TD PT24
21.0000 mg | MEDICATED_PATCH | Freq: Every day | TRANSDERMAL | Status: DC
  Administered 2012-11-19 – 2012-11-22 (×4): 21 mg via TRANSDERMAL
  Filled 2012-11-19 (×4): qty 1

## 2012-11-19 MED ORDER — SODIUM CHLORIDE 0.9 % IV SOLN
INTRAVENOUS | Status: DC
  Administered 2012-11-19 (×2): via INTRAVENOUS

## 2012-11-19 MED ORDER — CEFAZOLIN SODIUM 1-5 GM-% IV SOLN
1.0000 g | Freq: Four times a day (QID) | INTRAVENOUS | Status: AC
  Administered 2012-11-19 (×2): 1 g via INTRAVENOUS
  Filled 2012-11-19 (×2): qty 50

## 2012-11-19 MED ORDER — CISATRACURIUM BESYLATE (PF) 10 MG/5ML IV SOLN
INTRAVENOUS | Status: DC | PRN
  Administered 2012-11-19: 10 mg via INTRAVENOUS
  Administered 2012-11-19: 4 mg via INTRAVENOUS

## 2012-11-19 MED ORDER — ACETAMINOPHEN 650 MG RE SUPP: 650.0000 mg | Freq: Four times a day (QID) | RECTAL | Status: DC | PRN

## 2012-11-19 MED ORDER — POLYETHYLENE GLYCOL 3350 17 G PO PACK: 17.0000 g | PACK | Freq: Every day | ORAL | Status: DC | PRN

## 2012-11-19 MED ORDER — OXYCODONE HCL 5 MG PO TABS
15.0000 mg | ORAL_TABLET | ORAL | Status: DC | PRN
  Administered 2012-11-19 – 2012-11-22 (×17): 15 mg via ORAL
  Filled 2012-11-19 (×17): qty 3

## 2012-11-19 MED ORDER — DOCUSATE SODIUM 100 MG PO CAPS
100.0000 mg | ORAL_CAPSULE | Freq: Two times a day (BID) | ORAL | Status: DC
  Administered 2012-11-19 – 2012-11-22 (×6): 100 mg via ORAL

## 2012-11-19 MED ORDER — ALBUTEROL SULFATE HFA 108 (90 BASE) MCG/ACT IN AERS: 2.0000 | INHALATION_SPRAY | Freq: Four times a day (QID) | RESPIRATORY_TRACT | Status: DC | PRN

## 2012-11-19 MED ORDER — ALPRAZOLAM 1 MG PO TABS
1.0000 mg | ORAL_TABLET | Freq: Three times a day (TID) | ORAL | Status: DC | PRN
  Administered 2012-11-19 – 2012-11-22 (×9): 1 mg via ORAL
  Filled 2012-11-19 (×9): qty 1

## 2012-11-19 MED ORDER — CLONAZEPAM 1 MG PO TABS
1.0000 mg | ORAL_TABLET | Freq: Every evening | ORAL | Status: DC | PRN
  Administered 2012-11-19 – 2012-11-21 (×4): 1 mg via ORAL
  Filled 2012-11-19 (×4): qty 1

## 2012-11-19 MED ORDER — OXYCODONE HCL ER 20 MG PO T12A
20.0000 mg | EXTENDED_RELEASE_TABLET | Freq: Two times a day (BID) | ORAL | Status: DC
  Administered 2012-11-19 – 2012-11-22 (×6): 20 mg via ORAL
  Filled 2012-11-19 (×6): qty 1

## 2012-11-19 MED ORDER — LACTATED RINGERS IV SOLN
INTRAVENOUS | Status: DC | PRN
  Administered 2012-11-19 (×2): via INTRAVENOUS

## 2012-11-19 MED ORDER — MIDAZOLAM HCL 5 MG/5ML IJ SOLN
INTRAMUSCULAR | Status: DC | PRN
  Administered 2012-11-19: 1 mg via INTRAVENOUS

## 2012-11-19 MED ORDER — METHOCARBAMOL 500 MG PO TABS
500.0000 mg | ORAL_TABLET | Freq: Four times a day (QID) | ORAL | Status: DC | PRN
  Administered 2012-11-19 – 2012-11-21 (×5): 500 mg via ORAL
  Filled 2012-11-19 (×5): qty 1

## 2012-11-19 MED ORDER — ASPIRIN EC 325 MG PO TBEC
325.0000 mg | DELAYED_RELEASE_TABLET | Freq: Two times a day (BID) | ORAL | Status: DC
  Administered 2012-11-19 – 2012-11-22 (×6): 325 mg via ORAL
  Filled 2012-11-19 (×8): qty 1

## 2012-11-19 MED ORDER — SODIUM CHLORIDE 0.9 % IR SOLN
Status: DC | PRN
  Administered 2012-11-19: 1000 mL

## 2012-11-19 MED ORDER — MEPERIDINE HCL 50 MG/ML IJ SOLN: 6.2500 mg | INTRAMUSCULAR | Status: DC | PRN

## 2012-11-19 MED ORDER — QUETIAPINE FUMARATE ER 400 MG PO TB24
400.0000 mg | ORAL_TABLET | Freq: Every day | ORAL | Status: DC
  Administered 2012-11-19 – 2012-11-21 (×3): 400 mg via ORAL
  Filled 2012-11-19 (×4): qty 1

## 2012-11-19 MED ORDER — PROMETHAZINE HCL 25 MG/ML IJ SOLN: 6.2500 mg | INTRAMUSCULAR | Status: DC | PRN

## 2012-11-19 MED ORDER — MENTHOL 3 MG MT LOZG: 1.0000 | LOZENGE | OROMUCOSAL | Status: DC | PRN

## 2012-11-19 MED ORDER — PROPOFOL 10 MG/ML IV BOLUS
INTRAVENOUS | Status: DC | PRN
  Administered 2012-11-19: 160 mg via INTRAVENOUS

## 2012-11-19 MED ORDER — ONDANSETRON HCL 4 MG PO TABS: 4.0000 mg | ORAL_TABLET | Freq: Four times a day (QID) | ORAL | Status: DC | PRN

## 2012-11-19 MED ORDER — ACETAMINOPHEN 325 MG PO TABS
650.0000 mg | ORAL_TABLET | Freq: Four times a day (QID) | ORAL | Status: DC | PRN
  Administered 2012-11-20 – 2012-11-21 (×2): 650 mg via ORAL
  Filled 2012-11-19 (×2): qty 2

## 2012-11-19 MED ORDER — FENTANYL CITRATE 0.05 MG/ML IJ SOLN
INTRAMUSCULAR | Status: DC | PRN
  Administered 2012-11-19: 50 ug via INTRAVENOUS
  Administered 2012-11-19: 100 ug via INTRAVENOUS
  Administered 2012-11-19 (×2): 50 ug via INTRAVENOUS

## 2012-11-19 MED ORDER — BISACODYL 5 MG PO TBEC: 5.0000 mg | DELAYED_RELEASE_TABLET | Freq: Every day | ORAL | Status: DC | PRN

## 2012-11-19 MED ORDER — METOCLOPRAMIDE HCL 5 MG/ML IJ SOLN: 5.0000 mg | Freq: Three times a day (TID) | INTRAMUSCULAR | Status: DC | PRN

## 2012-11-19 MED ORDER — ZOLPIDEM TARTRATE 5 MG PO TABS
5.0000 mg | ORAL_TABLET | Freq: Every evening | ORAL | Status: DC | PRN
  Administered 2012-11-19: 10 mg via ORAL
  Administered 2012-11-20 – 2012-11-21 (×4): 5 mg via ORAL
  Filled 2012-11-19: qty 1
  Filled 2012-11-19: qty 2
  Filled 2012-11-19 (×3): qty 1

## 2012-11-19 SURGICAL SUPPLY — 43 items
ADH SKN CLS APL DERMABOND .7 (GAUZE/BANDAGES/DRESSINGS) ×1
BAG SPEC THK2 15X12 ZIP CLS (MISCELLANEOUS)
BAG ZIPLOCK 12X15 (MISCELLANEOUS) ×2 IMPLANT
BLADE SAW SGTL 18X1.27X75 (BLADE) ×2 IMPLANT
CAPT HIP PF COP ×1 IMPLANT
CELLS DAT CNTRL 66122 CELL SVR (MISCELLANEOUS) ×1 IMPLANT
CLOTH BEACON ORANGE TIMEOUT ST (SAFETY) ×2 IMPLANT
DERMABOND ADVANCED (GAUZE/BANDAGES/DRESSINGS) ×1
DERMABOND ADVANCED .7 DNX12 (GAUZE/BANDAGES/DRESSINGS) ×1 IMPLANT
DRAPE C-ARM 42X120 X-RAY (DRAPES) ×2 IMPLANT
DRAPE STERI IOBAN 125X83 (DRAPES) ×2 IMPLANT
DRAPE U-SHAPE 47X51 STRL (DRAPES) ×6 IMPLANT
DRSG AQUACEL AG ADV 3.5X10 (GAUZE/BANDAGES/DRESSINGS) ×2 IMPLANT
DURAPREP 26ML APPLICATOR (WOUND CARE) ×2 IMPLANT
ELECT BLADE TIP CTD 4 INCH (ELECTRODE) ×2 IMPLANT
ELECT REM PT RETURN 9FT ADLT (ELECTROSURGICAL) ×2
ELECTRODE REM PT RTRN 9FT ADLT (ELECTROSURGICAL) ×1 IMPLANT
FACESHIELD LNG OPTICON STERILE (SAFETY) ×8 IMPLANT
GLOVE BIO SURGEON STRL SZ7.5 (GLOVE) ×2 IMPLANT
GLOVE BIOGEL PI IND STRL 8 (GLOVE) ×2 IMPLANT
GLOVE BIOGEL PI INDICATOR 8 (GLOVE) ×2
GLOVE ECLIPSE 8.0 STRL XLNG CF (GLOVE) ×2 IMPLANT
GOWN STRL NON-REIN LRG LVL3 (GOWN DISPOSABLE) ×1 IMPLANT
GOWN STRL REIN XL XLG (GOWN DISPOSABLE) ×4 IMPLANT
HANDPIECE INTERPULSE COAX TIP (DISPOSABLE) ×2
KIT BASIN OR (CUSTOM PROCEDURE TRAY) ×2 IMPLANT
NS IRRIG 1000ML POUR BTL (IV SOLUTION) ×1 IMPLANT
PACK TOTAL JOINT (CUSTOM PROCEDURE TRAY) ×2 IMPLANT
PADDING CAST COTTON 6X4 STRL (CAST SUPPLIES) ×2 IMPLANT
RETRACTOR WND ALEXIS 18 MED (MISCELLANEOUS) ×1 IMPLANT
RTRCTR WOUND ALEXIS 18CM MED (MISCELLANEOUS) ×2
SET HNDPC FAN SPRY TIP SCT (DISPOSABLE) ×1 IMPLANT
SUT ETHIBOND NAB CT1 #1 30IN (SUTURE) ×4 IMPLANT
SUT ETHILON 3 0 PS 1 (SUTURE) ×2 IMPLANT
SUT MNCRL AB 4-0 PS2 18 (SUTURE) ×2 IMPLANT
SUT VIC AB 0 CT1 36 (SUTURE) ×2 IMPLANT
SUT VIC AB 1 CT1 36 (SUTURE) ×3 IMPLANT
SUT VIC AB 2-0 CT1 27 (SUTURE) ×4
SUT VIC AB 2-0 CT1 TAPERPNT 27 (SUTURE) ×2 IMPLANT
SUT VLOC 180 0 24IN GS25 (SUTURE) ×1 IMPLANT
TOWEL OR 17X26 10 PK STRL BLUE (TOWEL DISPOSABLE) ×2 IMPLANT
TOWEL OR NON WOVEN STRL DISP B (DISPOSABLE) ×2 IMPLANT
WATER STERILE IRR 1500ML POUR (IV SOLUTION) ×2 IMPLANT

## 2012-11-19 NOTE — Anesthesia Postprocedure Evaluation (Signed)
  Anesthesia Post-op Note  Patient: [name]  Procedure(s) Performed: Procedure(s) (LRB): RIGHT TOTAL HIP ARTHROPLASTY ANTERIOR APPROACH (Right)  Patient Location: PACU  Anesthesia Type: General  Level of Consciousness: awake and alert   Airway and Oxygen Therapy: Patient Spontanous Breathing  Post-op Pain: mild  Post-op Assessment: Post-op Vital signs reviewed, Patient's Cardiovascular Status Stable, Respiratory Function Stable, Patent Airway and No signs of Nausea or vomiting  Last Vitals:  Filed Vitals:   11/19/12 1531  BP:   Pulse:   Temp:   Resp: 16    Post-op Vital Signs: stable   Complications: No apparent anesthesia complications

## 2012-11-19 NOTE — Op Note (Signed)
NAMESHAROD, [name]               ACCOUNT NO.:  [id number]  MEDICAL RECORD NO.:  [id number]  LOCATION:  [facility]                         FACILITY:  [facility]  PHYSICIAN:  Vanita Panda. Magnus Ivan, M.D.DATE OF BIRTH:  1965/08/24  DATE OF PROCEDURE:  11/19/2012 DATE OF DISCHARGE:                              OPERATIVE REPORT   PREOPERATIVE DIAGNOSIS:  Avascular necrosis, right hip.  POSTOPERATIVE DIAGNOSIS:  Avascular necrosis, right hip.  PROCEDURE:  Right total hip arthroplasty through direct anterior approach.  IMPLANTS:  DePuy Sector Gription acetabular component size 52, size 36+ 4 neutral polyethylene liner, apex hole eliminator guide, size 12 Corail femoral component with standard offset, size 36+ 1.5 ceramic hip ball.  SURGEON:  Doneen Poisson, MD.  ASSISTANT:  Richardean Canal, PA-C  ANESTHESIA:  General.  ANTIBIOTICS:  A 2 g of IV Ancef.  BLOOD LOSS:  400 mL.  COMPLICATIONS:  None.  INDICATIONS:  [name] is a 47 year old gentleman who has a past history of alcohol abuse.  He subsequently then developed avascular necrosis of both of his hips with the right being worse than the left. This was verified on plain films and mainly an MRI.  He has gotten to the point where his mobility is quite limited to his pain daily.  His quality of life was diminished.  He wished to proceed with a direct anterior hip replacement on the right side.  He understands the risk of acute blood loss anemia, nerve and vessel injury, fracture, infection, and DVT.  He understands the goals are to decrease pain and improve mobility and improve quality of life.  PROCEDURE DESCRIPTION:  After informed consent was obtained, appropriate right hip was marked, he was brought to the operating room and general anesthesia was obtained while he was on a stretcher.  Traction boots were then placed on his feet.  He was next placed supine on the Hana fracture table with the perineal post in place and both  feet in inline skeletal traction devices but no traction applied.  His hip was assessed fluoroscopically, so we could obtain our center of the pelvis for measuring leg lengths.  The right hip was then prepped and draped with DuraPrep and sterile drapes sterile drape was placed on the C-arm unit. Time-out was called to identify correct patient and correct right hip. I then made an incision inferior and posterior to the anterior-superior iliac spine and carried this obliquely down the leg.  We dissected down to the tensor fascia lata muscle and the tensor fascia was divided longitudinally.  I then proceeded with a direct anterior approach to the hip.  A Cobra retractor was placed around the lateral neck and up underneath the rectus femoris.  The Cobra retractor was placed medially. I cauterized the lateral femoral circumflex vessels and then opened up the hip capsule in an L type format with a large effusion encountered in the hip.  I placed Cobra retractors within the hip capsule around the lateral medial femoral neck.  I made my femoral neck cut proximal to the lesser trochanter with an oscillating saw and completed this with an osteotome.  I placed a corkscrew guide in the femoral head and the femoral  head in its entirety.  I then removed the femoral head in its entirety.  I cleaned the acetabulum debris including remnants of the labrum.  I then placed a bent Hohmann medially and a Cobra retractor around the lateral acetabular neck.  I then started reaming starting with a size 42 reamer in 2 mm increments up to a size 52 with all reamers placed under direct visualization and last reamer also under direct fluoroscopy __________ inclination and anteversion.  Once that was accomplished, I placed the real DePuy Sector Gription acetabular component size 52, the apex hole eliminator guide and the size 36+ 4 neutral polyethylene liner.  Attention was then turned to the femur. The leg externally  rotated to 100 degrees, extended and adducted.  I was able to place a Mueller retractor medially and a Hohmann retractor behind the greater trochanter.  I released the lateral joint capsule and then used a box cutting osteotome to enter the femoral canal and a rongeur to lateralize then began broaching using the Corail  femoral broaching system from DePuy from a size 8 up to a size 12 and the size 12 was felt to be stable.  I used a calcar planer off this and then trialed a standard neck and a 36+ 1.5 hip ball.  We brought the leg back over and up and with traction and internal rotation reduced the pelvis. It was stable throughout its arc of rotation past 100 degrees external rotation and 45 degrees internal rotation.  Shuck was minimal and leg lengths were measured near equal under direct fluoroscopy.  We then dislocated the hip, then brought the leg back down.  We removed the trial components then placed the real Corail femoral component size 12 with standard offset and the real 36+ 1.5 ceramic hip ball.  We brought the leg back up and with traction and internal rotation reduced the pelvis and again it was stable.  We verified this under fluoroscopy as well.  We then irrigated the soft tissue with normal saline solution using pulsatile lavage.  We closed the joint capsule with interrupted #1 Ethibond suture followed by running 2-0 V-Loc on the tensor fascia, 0 Vicryl in the deep tissue, 2-0 Vicryl in subcutaneous tissue, Monocryl subcuticular stitch and Dermabond on the skin.  An Aquacel dressing was applied.  He was taken off the Hana table, awakened, extubated, and taken to the recovery room in stable condition.  All final counts were correct, and there were no complications.     Vanita Panda. Magnus Ivan, M.D.     CYB/MEDQ  D:  11/19/2012  T:  11/19/2012  Job:  [id number]

## 2012-11-19 NOTE — Brief Op Note (Signed)
11/19/2012  8:56 AM  PATIENT:  [name]  47 y.o. male  PRE-OPERATIVE DIAGNOSIS:  Severe avascular necrosis right hip  POST-OPERATIVE DIAGNOSIS:  Severe avascular necrosis right hip  PROCEDURE:  Procedure(s): RIGHT TOTAL HIP ARTHROPLASTY ANTERIOR APPROACH (Right)  SURGEON:  Surgeon(s) and Role:    * Kathryne Hitch, MD - Primary  PHYSICIAN ASSISTANT: Rexene Edison, PA-C  ANESTHESIA:   general  EBL:  Total I/O In: 1000 [I.V.:1000] Out: 400 [Blood:400]  BLOOD ADMINISTERED:none  DRAINS: none   LOCAL MEDICATIONS USED:  NONE  SPECIMEN:  No Specimen  DISPOSITION OF SPECIMEN:  [name]  COUNTS:  YES  TOURNIQUET:  * No tourniquets in log *  DICTATION: .Other Dictation: Dictation Number [id number]  PLAN OF CARE: Admit to inpatient   PATIENT DISPOSITION:  PACU - hemodynamically stable.   Delay start of Pharmacological VTE agent (>24hrs) due to surgical blood loss or risk of bleeding: no

## 2012-11-19 NOTE — H&P (Signed)
TOTAL HIP ADMISSION H&P  Patient is admitted for right total hip arthroplasty.  Subjective:  Chief Complaint: right hip pain  HPI: [name], 47 y.o. male, has a history of pain and functional disability in the right hip(s) due to avascular necrosis and patient has failed non-surgical conservative treatments for greater than 12 weeks to include NSAID's and/or analgesics and activity modification and steroid infjections. Onset of symptoms was gradual starting 2 years ago with rapidlly worsening course since that time.The patient noted no past surgery on the right hip(s).  Patient currently rates pain in the right hip at 10 out of 10 with activity. Patient has night pain, worsening of pain with activity and weight bearing, trendelenberg gait, pain that interfers with activities of daily living, pain with passive range of motion and crepitus. Patient has evidence of subchondral cysts, subchondral sclerosis, periarticular osteophytes and joint space narrowing by imaging studies. This condition presents safety issues increasing the risk of falls.  There is no current active infection.  Patient Active Problem List   Diagnosis Date Noted  . Avascular necrosis of bone of right hip 11/19/2012  . Abdominal pain 12/16/2011  . Chronic pancreatitis 12/16/2011  . Anxiety 12/16/2011  . Depression 12/16/2011   Past Medical History  Diagnosis Date  . Anxiety   . Depression   . Insomnia   . Neck pain     pt states he has 3 bulging disks -no surgery  . PTSD (post-traumatic stress disorder)   . Chronic abdominal pain   . Tachycardia     pt states his heart rate may be elevated because of anxiety over planned surgery  . Sleep apnea     has cpap - but does not use-does not know setting  . GERD (gastroesophageal reflux disease)   . Arthritis     avascular necrosis bilateral hips - right hip pain usally hurts worse.   neck pain-has bulging disks in his neck -  . Carotid artery stenosis     "MILD, LESS  THAN OR EQUAL TO 39%, LEFT AND RIGHT INTERNAL CAROTID ARTERY STENOSIS" - PER ULTRASOUND REPORT 09/28/12 [facility].    Past Surgical History  Procedure Laterality Date  . Cholecystectomy    . Colonoscopy      Prescriptions prior to admission  Medication Sig Dispense Refill  . albuterol (VENTOLIN HFA) 108 (90 BASE) MCG/ACT inhaler Inhale 2 puffs into the lungs. 2 puffs three times a day as needed      . ALPRAZolam (XANAX) 1 MG tablet Take 1 mg by mouth 3 (three) times daily as needed. For anxiety      . clonazePAM (KLONOPIN) 1 MG tablet Take 1 mg by mouth at bedtime. Sleep--may repeat x 1 if unable to sleep      . esomeprazole (NEXIUM) 40 MG capsule Take 40 mg by mouth daily before breakfast. Pt told he can take twice if needed - but always takes one every am      . folic acid (FOLVITE) 1 MG tablet Take 1 mg by mouth daily.      Marland Kitchen oxyCODONE (ROXICODONE) 15 MG immediate release tablet Take 15 mg by mouth. Takes one four times a day      . QUEtiapine (SEROQUEL XR) 400 MG 24 hr tablet Take 400 mg by mouth at bedtime.      . Vitamin D, Ergocalciferol, (DRISDOL) 50000 UNITS CAPS Take 50,000 Units by mouth every 7 (seven) days. Takes on Monday      .  zolpidem (AMBIEN CR) 12.5 MG CR tablet Take 12.5 mg by mouth at bedtime. sleep      . Oxycodone HCl 10 MG TABS Take 10 mg by mouth 4 (four) times daily.      . QUEtiapine (SEROQUEL XR) 200 MG 24 hr tablet Take 200 mg by mouth at bedtime.       Allergies  Allergen Reactions  . Antihistamines, Chlorpheniramine-Type Other (See Comments)    All antihistamines-Behavior issues  . Prednisone Other (See Comments)    anxiety    History  Substance Use Topics  . Smoking status: Current Every Day Smoker -- 1.00 packs/day for 31 years    Types: Cigarettes  . Smokeless tobacco: Not on file  . Alcohol Use: Yes     Comment: once a week - a beer---states he no longer uses marijuana and denies use of any other recreational drugs/ office notes obtained  from [facility] - last drug screen 10/22/12 positive for marijuana    Family History  Problem Relation Age of Onset  . Diabetes Mother      Review of Systems  Musculoskeletal: Positive for joint pain.  All other systems reviewed and are negative.    Objective:  Physical Exam  Constitutional: He is oriented to person, place, and time. He appears well-developed and well-nourished.  HENT:  Head: Normocephalic and atraumatic.  Eyes: EOM are normal. Pupils are equal, round, and reactive to light.  Neck: Normal range of motion. Neck supple.  Cardiovascular: Normal rate and regular rhythm.   Respiratory: Effort normal and breath sounds normal.  GI: Soft. Bowel sounds are normal.  Musculoskeletal:       Right hip: He exhibits decreased range of motion, decreased strength, bony tenderness and crepitus.  Neurological: He is alert and oriented to person, place, and time.  Skin: Skin is warm and dry.  Psychiatric: He has a normal mood and affect.    Vital signs in last 24 hours: Temp:  [97.7 F (36.5 C)] 97.7 F (36.5 C) (08/22 0512) Pulse Rate:  [108] 108 (08/22 0512) Resp:  [16] 16 (08/22 0512) BP: (124)/(80) 124/80 mmHg (08/22 0512) SpO2:  [96 %] 96 % (08/22 0512)  Labs:   Estimated body mass index is 43.45 kg/(m^2) as calculated from the following:   Height as of 11/11/12: 5' 1.5" (1.562 m).   Weight as of 12/15/11: 106 kg (233 lb 11 oz).   Imaging Review Plain radiographs demonstrate severe degenerative joint disease of the right hip(s). The bone quality appears to be good for age and reported activity level.  Assessment/Plan:  End stage arthritis, right hip(s)  The patient history, physical examination, clinical judgement of the provider and imaging studies are consistent with end stage degenerative joint disease of the right hip(s) and total hip arthroplasty is deemed medically necessary. The treatment options including medical management, injection therapy,  arthroscopy and arthroplasty were discussed at length. The risks and benefits of total hip arthroplasty were presented and reviewed. The risks due to aseptic loosening, infection, stiffness, dislocation/subluxation,  thromboembolic complications and other imponderables were discussed.  The patient acknowledged the explanation, agreed to proceed with the plan and consent was signed. Patient is being admitted for inpatient treatment for surgery, pain control, PT, OT, prophylactic antibiotics, VTE prophylaxis, progressive ambulation and ADL's and discharge planning.The patient is planning to be discharged home with home health services

## 2012-11-19 NOTE — Evaluation (Signed)
Physical Therapy Evaluation Patient Details Name: [name] MRN: [id number] DOB: Oct 02, 1965 Today's Date: 11/19/2012 Time: 7829-5621 PT Time Calculation (min): 21 min  PT Assessment / Plan / Recommendation History of Present Illness     Clinical Impression  Pt s/p R THR presents with decreased R LE strength/ROM and post op pain limiting functional mobility.  Pt would benefit from ST-SNF level rehab prior to d/c home with limited assist    PT Assessment  Patient needs continued PT services    Follow Up Recommendations  SNF    Does the patient have the potential to tolerate intense rehabilitation      Barriers to Discharge Inaccessible home environment      Equipment Recommendations  None recommended by PT    Recommendations for Other Services OT consult   Frequency 7X/week    Precautions / Restrictions Precautions Precautions: Fall Restrictions Weight Bearing Restrictions: No Other Position/Activity Restrictions: WBAT   Pertinent Vitals/Pain 8/10; premed, RN aware, ice packs provided      Mobility  Bed Mobility Bed Mobility: Sit to Supine;Supine to Sit Supine to Sit: 4: Min assist;3: Mod assist Sit to Supine: 4: Min assist;3: Mod assist Details for Bed Mobility Assistance: cues for sequence and use of L LE to self assist Transfers Transfers: Sit to Stand;Stand to Sit Sit to Stand: 4: Min assist;3: Mod assist Stand to Sit: 4: Min assist Details for Transfer Assistance: cues for LE management and use of UEs to self assist Ambulation/Gait Ambulation/Gait Assistance: 4: Min assist Ambulation Distance (Feet): 58 Feet Assistive device: Rolling walker Ambulation/Gait Assistance Details: cues for sequence, posture and position from RW Gait Pattern: Step-to pattern;Shuffle;Decreased step length - left;Decreased step length - right;Trunk flexed    Exercises     PT Diagnosis: Difficulty walking  PT Problem List: Decreased strength;Decreased range of  motion;Decreased activity tolerance;Decreased mobility;Pain;Decreased knowledge of use of DME PT Treatment Interventions: DME instruction;Gait training;Stair training;Functional mobility training;Therapeutic activities;Therapeutic exercise;Patient/family education     PT Goals(Current goals can be found in the care plan section) Acute Rehab PT Goals Patient Stated Goal: Resume previous lifestyle with decreased pain PT Goal Formulation: With patient Time For Goal Achievement: 11/26/12 Potential to Achieve Goals: Good  Visit Information  Last PT Received On: 11/19/12 Assistance Needed: +1       Prior Functioning  Home Living Family/patient expects to be discharged to:: Skilled nursing facility Living Arrangements: Spouse/significant other Additional Comments: Pt's spouse works full time and takes care of elderly parents.  Home is [address] yrs old with many stairs Prior Function Level of Independence: Independent Communication Communication: No difficulties    Cognition  Cognition Arousal/Alertness: Awake/alert Behavior During Therapy: WFL for tasks assessed/performed Overall Cognitive Status: Within Functional Limits for tasks assessed    Extremity/Trunk Assessment Upper Extremity Assessment Upper Extremity Assessment: Overall WFL for tasks assessed Lower Extremity Assessment Lower Extremity Assessment: RLE deficits/detail Cervical / Trunk Assessment Cervical / Trunk Assessment: Normal   Balance    End of Session PT - End of Session Equipment Utilized During Treatment: Gait belt Activity Tolerance: Patient tolerated treatment well Patient left: in bed;with family/visitor present;with call bell/phone within reach Nurse Communication: Mobility status  GP     Drew Lips 11/19/2012, 3:26 PM

## 2012-11-19 NOTE — Transfer of Care (Signed)
Immediate Anesthesia Transfer of Care Note  Patient: [name]  Procedure(s) Performed: Procedure(s): RIGHT TOTAL HIP ARTHROPLASTY ANTERIOR APPROACH (Right)  Patient Location: PACU  Anesthesia Type:General  Level of Consciousness: awake, alert , sedated and patient cooperative  Airway & Oxygen Therapy: Patient Spontanous Breathing and Patient connected to face mask oxygen  Post-op Assessment: Report given to PACU RN and Post -op Vital signs reviewed and stable  Post vital signs: Reviewed and stable  Complications: No apparent anesthesia complications

## 2012-11-19 NOTE — Anesthesia Preprocedure Evaluation (Addendum)
Anesthesia Evaluation  Patient identified by MRN, date of birth, ID band Patient awake    Reviewed: Allergy & Precautions, H&P , NPO status , Patient's Chart, lab work & pertinent test results  Airway Mallampati: III TM Distance: >3 FB Neck ROM: Full    Dental no notable dental hx.    Pulmonary sleep apnea and Continuous Positive Airway Pressure Ventilation ,  breath sounds clear to auscultation  Pulmonary exam normal       Cardiovascular + Peripheral Vascular Disease (carotid stenosis <=39%) Rhythm:Regular Rate:Normal     Neuro/Psych PSYCHIATRIC DISORDERS Anxiety Depression Bipolar Disorder negative neurological ROS     GI/Hepatic negative GI ROS, Neg liver ROS,   Endo/Other  negative endocrine ROS  Renal/GU negative Renal ROS  negative genitourinary   Musculoskeletal negative musculoskeletal ROS (+)   Abdominal   Peds negative pediatric ROS (+)  Hematology negative hematology ROS (+)   Anesthesia Other Findings   Reproductive/Obstetrics negative OB ROS                          Anesthesia Physical Anesthesia Plan  ASA: II  Anesthesia Plan: General   Post-op Pain Management:    Induction: Intravenous  Airway Management Planned: Oral ETT  Additional Equipment:   Intra-op Plan:   Post-operative Plan: Extubation in OR  Informed Consent: I have reviewed the patients History and Physical, chart, labs and discussed the procedure including the risks, benefits and alternatives for the proposed anesthesia with the patient or authorized representative who has indicated his/her understanding and acceptance.   Dental advisory given  Plan Discussed with: CRNA  Anesthesia Plan Comments:         Anesthesia Quick Evaluation

## 2012-11-20 LAB — CBC
Hemoglobin: 11.9 g/dL — ABNORMAL LOW (ref 13.0–17.0)
MCHC: 33.6 g/dL (ref 30.0–36.0)
RDW: 13.4 % (ref 11.5–15.5)

## 2012-11-20 LAB — BASIC METABOLIC PANEL
GFR calc Af Amer: >90 mL/min (ref >90)
GFR calc non Af Amer: >90 mL/min (ref >90)
Potassium: 3.3 mEq/L — ABNORMAL LOW (ref 3.5–5.1)
Sodium: 138 mEq/L (ref 135–145)

## 2012-11-20 NOTE — Progress Notes (Addendum)
Clinical Social Work Department CLINICAL SOCIAL WORK PLACEMENT NOTE 11/20/2012  Patient:  [name], [name]  Account Number:  [id number] Admit date:  11/19/2012  Clinical Social Worker:  Doroteo Glassman  Date/time:  11/20/2012 11:31 AM  Clinical Social Work is seeking post-discharge placement for this patient at the following level of care:   SKILLED NURSING   (*CSW will update this form in Epic as items are completed)   11/20/2012  Patient/family provided with [name] Department of Clinical Social Work's list of facilities offering this level of care within the geographic area requested by the patient (or if unable, by the patient's family).  11/20/2012  Patient/family informed of their freedom to choose among providers that offer the needed level of care, that participate in Medicare, Medicaid or managed care program needed by the patient, have an available bed and are willing to accept the patient.  11/20/2012  Patient/family informed of [facility]' ownership interest in [facility], as well as of the fact that they are under no obligation to receive care at this facility.  PASARR submitted to [employer]DS on 11/20/2012 PASARR number received from [employer] on 11/20/2012  FL2 transmitted to all facilities in geographic area requested by pt/family on  11/20/2012 FL2 transmitted to all facilities within larger geographic area on   Patient informed that his/her managed care company has contracts with or will negotiate with  certain facilities, including the following:     Patient/family informed of bed offers received:   Patient chooses bed at  Physician recommends and patient chooses bed at    Patient to be transferred to  on   Patient to be transferred to facility by   The following physician request were entered in Epic:   Additional Comments:  Providence Crosby, Theresia Majors Clinical Social Work [phone]

## 2012-11-20 NOTE — Evaluation (Signed)
Occupational Therapy Evaluation Patient Details Name: [name] MRN: [id number] DOB: 07-20-1965 Today's Date: 11/20/2012 Time: 4098-1191 OT Time Calculation (min): 38 min  OT Assessment / Plan / Recommendation History of present illness s/p R THA, Aa due to avascular necrosis   Clinical Impression   Pt requires min to mod assist for mobility and max assist for LB ADL  Will need post acute rehab in SNF setting.  Will defer OT to SNF.   OT Assessment  All further OT needs can be met in the next venue of care    Follow Up Recommendations  SNF    Barriers to Discharge      Equipment Recommendations  None recommended by OT    Recommendations for Other Services    Frequency       Precautions / Restrictions Precautions Precautions: Fall Restrictions Weight Bearing Restrictions: No Other Position/Activity Restrictions: WBAT   Pertinent Vitals/Pain R hip, moderate pain, repositioned, iced, RN notified   ADL  Eating/Feeding: Set up Where Assessed - Eating/Feeding: Chair Grooming: Set up Where Assessed - Grooming: Unsupported sitting Upper Body Bathing: Set up Where Assessed - Upper Body Bathing: Unsupported sitting Lower Body Bathing: Maximal assistance Where Assessed - Lower Body Bathing: Unsupported sitting;Supported sit to stand Upper Body Dressing: Set up Where Assessed - Upper Body Dressing: Unsupported sitting Lower Body Dressing: Maximal assistance Where Assessed - Lower Body Dressing: Unsupported sitting;Supported sit to stand Toilet Transfer: Minimal assistance Toilet Transfer Method: Sit to stand Equipment Used: Rolling walker Transfers/Ambulation Related to ADLs: min assist with RW, verbal and physical cues for upright posture and sequence    OT Diagnosis: Generalized weakness;Acute pain  OT Problem List: Decreased activity tolerance;Impaired balance (sitting and/or standing);Decreased strength;Decreased coordination;Decreased knowledge of use of DME or  AE;Cardiopulmonary status limiting activity;Impaired UE functional use;Pain OT Treatment Interventions:     OT Goals(Current goals can be found in the care plan section) Acute Rehab OT Goals Patient Stated Goal: Resume previous lifestyle with decreased pain  Visit Information  Last OT Received On: 11/20/12 Assistance Needed: +1 PT/OT Co-Evaluation/Treatment: Yes History of Present Illness: s/p R THA, Aa due to avascular necrosis       Prior Functioning     Home Living Family/patient expects to be discharged to:: Skilled nursing facility Communication Communication: No difficulties Dominant Hand: Right         Vision/Perception Vision - History Baseline Vision: Wears glasses only for reading Patient Visual Report: No change from baseline   Cognition  Cognition Arousal/Alertness: Awake/alert Behavior During Therapy: WFL for tasks assessed/performed Overall Cognitive Status: Within Functional Limits for tasks assessed    Extremity/Trunk Assessment Upper Extremity Assessment Upper Extremity Assessment: LUE deficits/detail LUE Deficits / Details: limited strength and coordination x 1 month believed to be "nerve compression' LUE Coordination: decreased fine motor Lower Extremity Assessment Lower Extremity Assessment: Defer to PT evaluation Cervical / Trunk Assessment Cervical / Trunk Assessment: Normal     Mobility Bed Mobility Bed Mobility: Sit to Supine Sit to Supine: 3: Mod assist Details for Bed Mobility Assistance: cues for sequence and use of L LE to self assist Transfers Transfers: Sit to Stand;Stand to Sit Sit to Stand: 4: Min assist;With upper extremity assist;From chair/3-in-1 Stand to Sit: 4: Min assist;With upper extremity assist;To bed;To elevated surface Details for Transfer Assistance: cues for LE management and use of UEs to self assist     Exercise     Balance     End of Session OT - End of Session  Activity Tolerance: Patient limited by  fatigue;Patient limited by pain Patient left: in bed;with call bell/phone within reach;with family/visitor present Nurse Communication:  (HR increased to 120s with ambulation)  GO     Evern Bio 11/20/2012, 2:36 PM [phone]

## 2012-11-20 NOTE — Progress Notes (Signed)
Patient ID: [name], male   DOB: February 05, 1966, 47 y.o.   MRN: [id number] Postoperative day 1 anterior approach total hip arthroplasty. Patient complains of increased pain this morning. Hemoglobin stable. Continue physical therapy progressive ambulation.

## 2012-11-20 NOTE — Progress Notes (Signed)
Physical Therapy Treatment Patient Details Name: [name] MRN: [id number] DOB: [birth year] Today's Date: 11/20/2012 Time: 4098-1191 PT Time Calculation (min): 28 min  PT Assessment / Plan / Recommendation  History of Present Illness s/p R THA, Aa due to avascular necrosis   PT Comments     Follow Up Recommendations  SNF     Does the patient have the potential to tolerate intense rehabilitation     Barriers to Discharge        Equipment Recommendations  None recommended by PT    Recommendations for Other Services OT consult  Frequency 7X/week   Progress towards PT Goals Progress towards PT goals: Progressing toward goals  Plan Current plan remains appropriate    Precautions / Restrictions Precautions Precautions: Fall Restrictions Weight Bearing Restrictions: No Other Position/Activity Restrictions: WBAT   Pertinent Vitals/Pain 7/10; premed, RN aware, ice packs provided    Mobility  Bed Mobility Bed Mobility: Sit to Supine Sit to Supine: 3: Mod assist Details for Bed Mobility Assistance: cues for sequence and use of L LE to self assist Transfers Transfers: Sit to Stand;Stand to Sit Sit to Stand: 4: Min assist;With upper extremity assist;From chair/3-in-1 Stand to Sit: 4: Min assist;With upper extremity assist;To bed;To elevated surface Details for Transfer Assistance: cues for LE management and use of UEs to self assist Ambulation/Gait Ambulation/Gait Assistance: 4: Min assist Ambulation Distance (Feet): 34 Feet Assistive device: Rolling walker Ambulation/Gait Assistance Details: constant cues for posture, sequence, stride length and position from RW Gait Pattern: Step-to pattern;Shuffle;Decreased step length - left;Decreased step length - right;Trunk flexed Gait velocity: sloooooooow    Exercises     PT Diagnosis:    PT Problem List:   PT Treatment Interventions:     PT Goals (current goals can now be found in the care plan section) Acute Rehab PT  Goals Patient Stated Goal: Resume previous lifestyle with decreased pain PT Goal Formulation: With patient Time For Goal Achievement: 11/26/12 Potential to Achieve Goals: Good  Visit Information  Last PT Received On: 11/20/12 Assistance Needed: +1 PT/OT Co-Evaluation/Treatment: Yes History of Present Illness: s/p R THA, Aa due to avascular necrosis    Subjective Data  Subjective: I did better this afternoon Patient Stated Goal: Resume previous lifestyle with decreased pain   Cognition  Cognition Arousal/Alertness: Awake/alert Behavior During Therapy: WFL for tasks assessed/performed Overall Cognitive Status: Within Functional Limits for tasks assessed    Balance     End of Session PT - End of Session Equipment Utilized During Treatment: Gait belt Activity Tolerance: Patient tolerated treatment well Patient left: in bed;with call bell/phone within reach;with family/visitor present Nurse Communication: Mobility status   GP     [name] 11/20/2012, 4:55 PM

## 2012-11-20 NOTE — Progress Notes (Signed)
Clinical Social Work Department BRIEF PSYCHOSOCIAL ASSESSMENT 11/20/2012  Patient:  [name], [name]     Account Number:  [id number]     Admit date:  11/19/2012  Clinical Social Worker:  Doroteo Glassman  Date/Time:  11/20/2012 11:28 AM  Referred by:  Physician  Date Referred:  11/20/2012 Referred for  SNF Placement   Other Referral:   Interview type:  Patient Other interview type:   Pt's wife at bedside    PSYCHOSOCIAL DATA Living Status:  WIFE Admitted from facility:   Level of care:   Primary support name:  Harriett Sine Primary support relationship to patient:  SPOUSE Degree of support available:   strong    CURRENT CONCERNS Current Concerns  Post-Acute Placement   Other Concerns:    SOCIAL WORK ASSESSMENT / PLAN Met with Pt and wife to discuss d/c plans.    Pt reported that he and his wife were aware that he would need SNF at d/c and so they made arrangements with [facility]. Pt's wife confirms that Pt has been accepted by [facility] and that the facility is able to accommodate Pt upon d/c.    CSW thanked Pt and his wife for their time.   Assessment/plan status:  Psychosocial Support/Ongoing Assessment of Needs Other assessment/ plan:   Information/referral to community resources:   SNF list--just an FYI for Pt and wife    PATIENT'S/FAMILY'S RESPONSE TO PLAN OF CARE: Pt and wife understand and accept that Pt cannot return home upon d/c, as their 48 year old house is not conducive to someone needing physical assistance.    Pt and wife thanked CSW for time and assistance.   Providence Crosby, LCSWA Clinical Social Work [phone]

## 2012-11-20 NOTE — Progress Notes (Signed)
Physical Therapy Treatment Patient Details Name: [name] MRN: [id number] DOB: May 07, 1965 Today's Date: 11/20/2012 Time: 0930-1002 PT Time Calculation (min): 32 min  PT Assessment / Plan / Recommendation  History of Present Illness     PT Comments     Follow Up Recommendations  SNF     Does the patient have the potential to tolerate intense rehabilitation     Barriers to Discharge        Equipment Recommendations  None recommended by PT    Recommendations for Other Services OT consult  Frequency 7X/week   Progress towards PT Goals Progress towards PT goals: Progressing toward goals  Plan Current plan remains appropriate    Precautions / Restrictions Precautions Precautions: Fall Restrictions Weight Bearing Restrictions: No Other Position/Activity Restrictions: WBAT   Pertinent Vitals/Pain 9/10; premed, ice packs provided, RN aware    Mobility  Bed Mobility Bed Mobility: Supine to Sit Supine to Sit: 4: Min assist;3: Mod assist Details for Bed Mobility Assistance: cues for sequence and use of L LE to self assist Transfers Transfers: Sit to Stand;Stand to Sit Sit to Stand: 3: Mod assist Stand to Sit: 4: Min assist;3: Mod assist Details for Transfer Assistance: cues for LE management and use of UEs to self assist Ambulation/Gait Ambulation/Gait Assistance: 1: +2 Total assist Ambulation/Gait: Patient Percentage: 70% Ambulation Distance (Feet): 26 Feet Assistive device: Rolling walker Ambulation/Gait Assistance Details: cues for sequence, posture, position from RW; physical assist for balance, support and initially to advance R LE Gait Pattern: Step-to pattern;Shuffle;Decreased step length - left;Decreased step length - right;Trunk flexed    Exercises Total Joint Exercises Ankle Circles/Pumps: AROM;15 reps;Supine;Both Quad Sets: AROM;Both;10 reps;Supine Heel Slides: AAROM;15 reps;Supine;Right Hip ABduction/ADduction: AAROM;Right;10 reps;Supine   PT  Diagnosis:    PT Problem List:   PT Treatment Interventions:     PT Goals (current goals can now be found in the care plan section) Acute Rehab PT Goals Patient Stated Goal: Resume previous lifestyle with decreased pain PT Goal Formulation: With patient Time For Goal Achievement: 11/26/12 Potential to Achieve Goals: Good  Visit Information  Last PT Received On: 11/20/12 Assistance Needed: +1    Subjective Data  Subjective: It hurts a lot Patient Stated Goal: Resume previous lifestyle with decreased pain   Cognition  Cognition Arousal/Alertness: Awake/alert Behavior During Therapy: WFL for tasks assessed/performed Overall Cognitive Status: Within Functional Limits for tasks assessed    Balance     End of Session PT - End of Session Equipment Utilized During Treatment: Gait belt Activity Tolerance: Patient tolerated treatment well Patient left: in chair;with call bell/phone within reach Nurse Communication: Mobility status   GP     [name] 11/20/2012, 12:24 PM

## 2012-11-21 LAB — CBC
Hemoglobin: 12.5 g/dL — ABNORMAL LOW (ref 13.0–17.0)
MCH: 34.1 pg — ABNORMAL HIGH (ref 26.0–34.0)
MCHC: 34.2 g/dL (ref 30.0–36.0)
MCV: 99.7 fL (ref 78.0–100.0)
RBC: 3.67 MIL/uL — ABNORMAL LOW (ref 4.22–5.81)

## 2012-11-21 NOTE — Progress Notes (Signed)
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  Spouse present.  Pt required increased time and is progressing slowly.  Pt plans to D/C to SNF for ST Rehab.   Follow Up Recommendations  SNF     Does the patient have the potential to tolerate intense rehabilitation     Barriers to Discharge        Equipment Recommendations       Recommendations for Other Services    Frequency 7X/week   Progress towards PT Goals Progress towards PT goals: Progressing toward goals  Plan      Precautions / Restrictions Precautions Precautions: Fall Restrictions Weight Bearing Restrictions: No Other Position/Activity Restrictions: WBAT    Pertinent Vitals/Pain C/o 8/10 R hip pain ICE applied    Mobility  Bed Mobility Bed Mobility: Not assessed Supine to Sit: 4: Min assist;3: Mod assist Details for Bed Mobility Assistance: pt OOB in recliner Transfers Transfers: Sit to Stand;Stand to Sit Sit to Stand: 4: Min assist;4: Min guard;From chair/3-in-1 Stand to Sit: 4: Min assist;To chair/3-in-1 Details for Transfer Assistance: 50% VC's on proper tech and hand placement plus increased time as pt appears groggy/slow (meds?) Ambulation/Gait Ambulation/Gait Assistance: 4: Min assist Ambulation Distance (Feet): 64 Feet Assistive device: Rolling walker Ambulation/Gait Assistance Details: 25% VC's on proper upright posture and increaseed time.  Difficulty advancing R LE prefering to amb with L LE leading.  Gait Pattern: Step-to pattern;Shuffle;Decreased step length - left;Decreased step length - right;Trunk flexed Gait velocity: decreased x 2     PT Goals (current goals can now be  found in the care plan section)    Visit Information  Last PT Received On: 11/21/12 Assistance Needed: +1 History of Present Illness: s/p R THA, Aa due to avascular necrosis    Subjective Data      Cognition       Balance     End of Session PT - End of Session Equipment Utilized During Treatment: Gait belt Activity Tolerance: Patient tolerated treatment well Patient left: in chair;with call bell/phone within reach Nurse Communication: Mobility status   Felecia Shelling  PTA [facility]  Acute  Rehab Pager      [phone]

## 2012-11-21 NOTE — Progress Notes (Signed)
Patient ID: [name], male   DOB: 06-06-1965, 47 y.o.   MRN: [id number] Patient is showing improvement with physical therapy, hemoglobin is stable. Possible discharge on Monday.

## 2012-11-21 NOTE — Progress Notes (Signed)
Physical Therapy Treatment Patient Details Name: [name] MRN: [id number] DOB: 1965/08/22 Today's Date: 11/21/2012 Time: 8841-6606 PT Time Calculation (min): 24 min  PT Assessment / Plan / Recommendation  History of Present Illness s/p R THA, Aa due to avascular necrosis   PT Comments   POD # 2 am session.  Pt pre medicated due to pain control issues as pt has Hx "slipped disc".  Assisted OOB to amb in hallway then positioned in recliner to perform TE's.  Pt required increased time and appeared groggy/slow.   Follow Up Recommendations  SNF     Does the patient have the potential to tolerate intense rehabilitation     Barriers to Discharge        Equipment Recommendations       Recommendations for Other Services    Frequency 7X/week   Progress towards PT Goals Progress towards PT goals: Progressing toward goals  Plan      Precautions / Restrictions Precautions Precautions: Fall Restrictions Weight Bearing Restrictions: No Other Position/Activity Restrictions: WBAT    Pertinent Vitals/Pain C/o 9/10 despite having all pain meds ICE applied    Mobility  Bed Mobility Bed Mobility: Supine to Sit Supine to Sit: 4: Min assist;3: Mod assist Details for Bed Mobility Assistance: cues for sequence and use of L LE to self assist plus increased time.  Pt slow and groggy. Transfers Transfers: Sit to Stand;Stand to Sit Sit to Stand: 4: Min assist;With upper extremity assist;From toilet;3: Mod assist Stand to Sit: 3: Mod assist;4: Min assist;To chair/3-in-1 Details for Transfer Assistance: 50% VC's on proper tech and hand placement plus increased time as pt appears groggy/slow (meds?) Ambulation/Gait Ambulation/Gait Assistance: 4: Min assist Ambulation Distance (Feet): 55 Feet Assistive device: Rolling walker Ambulation/Gait Assistance Details: 50% VC's on proper upright posture and proper walker to self distance.  very slow gait, nearly 11 min to complete gait distance.    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

## 2012-11-22 ENCOUNTER — Other Ambulatory Visit: Payer: Self-pay | Admitting: *Deleted

## 2012-11-22 ENCOUNTER — Encounter (HOSPITAL_COMMUNITY): Payer: Self-pay | Admitting: Orthopaedic Surgery

## 2012-11-22 LAB — CBC
HCT: 34 % — ABNORMAL LOW (ref 39.0–52.0)
MCH: 33.2 pg (ref 26.0–34.0)
MCHC: 33.2 g/dL (ref 30.0–36.0)
MCV: 100 fL (ref 78.0–100.0)
Platelets: 210 10*3/uL (ref 150–400)
RDW: 13 % (ref 11.5–15.5)
WBC: 4.7 10*3/uL (ref 4.0–10.5)

## 2012-11-22 MED ORDER — METHOCARBAMOL 500 MG PO TABS: 500.0000 mg | ORAL_TABLET | Freq: Four times a day (QID) | ORAL | Status: DC | PRN

## 2012-11-22 MED ORDER — OXYCODONE HCL 10 MG PO TABS
10.0000 mg | ORAL_TABLET | Freq: Four times a day (QID) | ORAL | Status: DC
Start: 2012-11-22 — End: 2013-02-13

## 2012-11-22 MED ORDER — ASPIRIN 325 MG PO TBEC: 325.0000 mg | DELAYED_RELEASE_TABLET | Freq: Two times a day (BID) | ORAL | Status: DC

## 2012-11-22 MED ORDER — ALPRAZOLAM 1 MG PO TABS: ORAL_TABLET | ORAL | Status: DC

## 2012-11-22 MED ORDER — ZOLPIDEM TARTRATE ER 12.5 MG PO TBCR: 12.5000 mg | EXTENDED_RELEASE_TABLET | Freq: Every day | ORAL | Status: DC

## 2012-11-22 MED ORDER — CLONAZEPAM 1 MG PO TABS: ORAL_TABLET | ORAL | Status: DC

## 2012-11-22 MED ORDER — OXYCODONE HCL 15 MG PO TABS: 15.0000 mg | ORAL_TABLET | ORAL | Status: DC | PRN

## 2012-11-22 NOTE — Care Management Note (Signed)
    Page 1 of 1   11/22/2012     2:20:53 PM   CARE MANAGEMENT NOTE 11/22/2012  Patient:  LEGEND, TUMMINELLO   Account Number:  [id number]  Date Initiated:  11/22/2012  Documentation initiated by:  Colleen Can  Subjective/Objective Assessment:   dx necrosis right hip; total hip replacemnt-anterior approach     Action/Plan:   SNF rehab   Anticipated DC Date:  11/22/2012   Anticipated DC Plan:  SKILLED NURSING FACILITY  In-house referral  Clinical Social Worker      DC Planning Services  CM consult      Choice offered to / List presented to:             Status of service:  Completed, signed off Medicare Important Message given?  NA - LOS <3 / Initial given by admissions (If response is "NO", the following Medicare IM given date fields will be blank) Date Medicare IM given:   Date Additional Medicare IM given:    Discharge Disposition:  SKILLED NURSING FACILITY  Per UR Regulation:    If discussed at Long Length of Stay Meetings, dates discussed:    Comments:

## 2012-11-22 NOTE — Progress Notes (Signed)
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  Pt plans to D/C [facility] for ST Rehab today.    Follow Up Recommendations  SNF     Does the patient have the potential to tolerate intense rehabilitation     Barriers to Discharge        Equipment Recommendations       Recommendations for Other Services    Frequency 7X/week   Progress towards PT Goals Progress towards PT goals: Progressing toward goals  Plan      Precautions / Restrictions Precautions Precautions: Fall Restrictions Weight Bearing Restrictions: No Other Position/Activity Restrictions: WBAT    Pertinent Vitals/Pain C/o 7/10  Pre medicated ICE applied    Mobility  Bed Mobility Bed Mobility: Supine to Sit;Sitting - Scoot to Edge of Bed Supine to Sit: 5: Supervision Sitting - Scoot to Edge of Bed: 5: Supervision Details for Bed Mobility Assistance: demon and instructed pt how to use a belt to assist R LE off bed.  Pt performed at Supervision level with increased time and <25% VC's on proper tech. Transfers Transfers: Sit to Stand;Stand to Sit Sit to Stand: 4: Min assist;4: Min guard;From bed Stand to Sit: 4: Min guard;4: Min assist;To chair/3-in-1 Details for Transfer Assistance: 25% VC's on proper tech and hand placement plus increased time but improved from yesterday. Ambulation/Gait Ambulation/Gait Assistance: 4: Min guard;4: Min Environmental consultant (Feet): 125 Feet Assistive device: Rolling walker Ambulation/Gait Assistance Details: increased time and <25% VC's on safety with turns.  Gait Pattern: Step-to pattern;Shuffle;Decreased step length - left;Decreased step length -  right;Trunk flexed    Exercises   Total Hip Replacement TE's 10 reps ankle pumps 10 reps knee presses 10 reps heel slides 10 reps SAQ's 10 reps ABD Followed by ICE    PT Goals (current goals can now be found in the care plan section)    Visit Information  Last PT Received On: 11/22/12 Assistance Needed: +1 History of Present Illness: s/p R THA, Aa due to avascular necrosis    Subjective Data      Cognition       Balance     End of Session PT - End of Session Equipment Utilized During Treatment: Gait belt Activity Tolerance: Patient tolerated treatment well Patient left: in chair;with call bell/phone within reach   Felecia Shelling  PTA [facility]  Acute  Rehab Pager      [phone]

## 2012-11-22 NOTE — Progress Notes (Signed)
Utilization review completed.  

## 2012-11-22 NOTE — Progress Notes (Signed)
Patient is set to discharge to [facility] today. Patient & wife at bedside aware. Discharge packet at nursing station. PTAR scheduled for pickup (Service Request Id: [id number]).  Clinical Social Work Department CLINICAL SOCIAL WORK PLACEMENT NOTE 11/22/2012  Patient:  [name], [name]  Account Number:  [id number] Admit date:  11/19/2012  Clinical Social Worker:  Doroteo Glassman  Date/time:  11/20/2012 11:31 AM  Clinical Social Work is seeking post-discharge placement for this patient at the following level of care:   SKILLED NURSING   (*CSW will update this form in Epic as items are completed)   11/20/2012  Patient/family provided with [name] Department of Clinical Social Work's list of facilities offering this level of care within the geographic area requested by the patient (or if unable, by the patient's family).  11/20/2012  Patient/family informed of their freedom to choose among providers that offer the needed level of care, that participate in Medicare, Medicaid or managed care program needed by the patient, have an available bed and are willing to accept the patient.  11/20/2012  Patient/family informed of [facility]' ownership interest in [facility], as well as of the fact that they are under no obligation to receive care at this facility.  PASARR submitted to EDS on 11/20/2012 PASARR number received from E[employer] on 11/22/2012  FL2 transmitted to all facilities in geographic area requested by pt/family on  11/20/2012 FL2 transmitted to all facilities within larger geographic area on   Patient informed that his/her managed care company has contracts with or will negotiate with  certain facilities, including the following:     Patient/family informed of bed offers received:  11/22/2012 Patient chooses bed at [facility] Physician recommends and patient chooses bed at    Patient to be transferred to [facility] on  11/22/2012 Patient to be transferred to  facility by PTAR  The following physician request were entered in Epic:   Additional Comments:   Unice Bailey, LCSW [facility] Clinical Social Worker cell #: [phone]

## 2012-11-22 NOTE — Progress Notes (Signed)
Subjective: 3 Days Post-Op Procedure(s) (LRB): RIGHT TOTAL HIP ARTHROPLASTY ANTERIOR APPROACH (Right) Patient reports pain as moderate.    Objective: Vital signs in last 24 hours: Temp:  [97.7 F (36.5 C)-100.5 F (38.1 C)] 97.7 F (36.5 C) (08/25 0520) Pulse Rate:  [93-115] 93 (08/25 0520) Resp:  [18-20] 20 (08/25 0730) BP: (106-121)/(67-71) 110/67 mmHg (08/25 0520) SpO2:  [96 %-98 %] 96 % (08/25 0520)  Intake/Output from previous day: 08/24 0701 - 08/25 0700 In: 1380 [P.O.:1380] Out: 1100 [Urine:1100] Intake/Output this shift: Total I/O In: 240 [P.O.:240] Out: -    Recent Labs  11/20/12 0500 11/21/12 0530 11/22/12 0411  HGB 11.9* 12.5* 11.3*    Recent Labs  11/21/12 0530 11/22/12 0411  WBC 6.1 4.7  RBC 3.67* 3.40*  HCT 36.6* 34.0*  PLT 215 210    Recent Labs  11/20/12 0500  NA 138  K 3.3*  CL 103  CO2 30  BUN <3*  CREATININE 0.84  GLUCOSE 104*  CALCIUM 8.7   No results found for this basename: LABPT, INR,  in the last 72 hours  Sensation intact distally Intact pulses distally Dorsiflexion/Plantar flexion intact Incision: dressing C/D/I  Assessment/Plan: 3 Days Post-Op Procedure(s) (LRB): RIGHT TOTAL HIP ARTHROPLASTY ANTERIOR APPROACH (Right) Discharge to SNF  [facility] 11/22/2012, 7:44 AM

## 2012-11-22 NOTE — Progress Notes (Signed)
Pt to be d/c'd to [facility].  Pt report called to Scarlette Calico, nurse at facility.  Pt has no concerns at this time and is ready to be d/c'd.

## 2012-11-22 NOTE — Discharge Summary (Signed)
Patient ID: [name] MRN: [id number] DOB/AGE: 06/24/1965 47 y.o.  Admit date: 11/19/2012 Discharge date: 11/22/2012  Admission Diagnoses:  Principal Problem:   Avascular necrosis of bone of right hip   Discharge Diagnoses:  Same  Past Medical History  Diagnosis Date  . Anxiety   . Depression   . Insomnia   . Neck pain     pt states he has 3 bulging disks -no surgery  . PTSD (post-traumatic stress disorder)   . Chronic abdominal pain   . Tachycardia     pt states his heart rate may be elevated because of anxiety over planned surgery  . Sleep apnea     has cpap - but does not use-does not know setting  . GERD (gastroesophageal reflux disease)   . Arthritis     avascular necrosis bilateral hips - right hip pain usally hurts worse.   neck pain-has bulging disks in his neck -  . Carotid artery stenosis     "MILD, LESS THAN OR EQUAL TO 39%, LEFT AND RIGHT INTERNAL CAROTID ARTERY STENOSIS" - PER ULTRASOUND REPORT 09/28/12 [facility].    Surgeries: Procedure(s): RIGHT TOTAL HIP ARTHROPLASTY ANTERIOR APPROACH on 11/19/2012   Consultants:    Discharged Condition: Improved  Hospital Course: [name] is an 47 y.o. male who was admitted 11/19/2012 for operative treatment ofAvascular necrosis of bone of right hip. Patient has severe unremitting pain that affects sleep, daily activities, and work/hobbies. After pre-op clearance the patient was taken to the operating room on 11/19/2012 and underwent  Procedure(s): RIGHT TOTAL HIP ARTHROPLASTY ANTERIOR APPROACH.    Patient was given perioperative antibiotics: Anti-infectives   Start     Dose/Rate Route Frequency Ordered Stop   11/19/12 1400  ceFAZolin (ANCEF) IVPB 1 g/50 mL premix     1 g 100 mL/hr over 30 Minutes Intravenous Every 6 hours 11/19/12 1047 11/19/12 2059   11/19/12 0508  ceFAZolin (ANCEF) IVPB 2 g/50 mL premix     2 g 100 mL/hr over 30 Minutes Intravenous On call to O.R. 11/19/12 4098 11/19/12 0725        Patient was given sequential compression devices, early ambulation, and chemoprophylaxis to prevent DVT.  Patient benefited maximally from hospital stay and there were no complications.    Recent vital signs: Patient Vitals for the past 24 hrs:  BP Temp Temp src Pulse Resp SpO2  11/22/12 0730 - - - - 20 -  11/22/12 0520 110/67 mmHg 97.7 F (36.5 C) Oral 93 20 96 %  11/22/12 0420 - - - - 20 -  11/22/12 0000 - - - - 18 -  11/21/12 2120 121/71 mmHg 98.9 F (37.2 C) Oral 102 20 96 %  11/21/12 1935 - - - - 20 -  11/21/12 1600 - - - - 20 98 %  11/21/12 1359 106/71 mmHg 100.5 F (38.1 C) Oral 115 20 97 %  11/21/12 1200 - - - - 20 98 %  11/21/12 0800 - - - - 20 98 %     Recent laboratory studies:  Recent Labs  11/20/12 0500 11/21/12 0530 11/22/12 0411  WBC 4.4 6.1 4.7  HGB 11.9* 12.5* 11.3*  HCT 35.4* 36.6* 34.0*  PLT 212 215 210  NA 138  --   --   K 3.3*  --   --   CL 103  --   --   CO2 30  --   --   BUN <3*  --   --  CREATININE 0.84  --   --   GLUCOSE 104*  --   --   CALCIUM 8.7  --   --      Discharge Medications:     Medication List         ALPRAZolam 1 MG tablet  Commonly known as:  XANAX  Take 1 mg by mouth 3 (three) times daily as needed. For anxiety     aspirin 325 MG EC tablet  Take 1 tablet (325 mg total) by mouth 2 (two) times daily after a meal.     clonazePAM 1 MG tablet  Commonly known as:  KLONOPIN  Take 1 mg by mouth at bedtime. Sleep--may repeat x 1 if unable to sleep     esomeprazole 40 MG capsule  Commonly known as:  NEXIUM  Take 40 mg by mouth daily before breakfast. Pt told he can take twice if needed - but always takes one every am     folic acid 1 MG tablet  Commonly known as:  FOLVITE  Take 1 mg by mouth daily.     methocarbamol 500 MG tablet  Commonly known as:  ROBAXIN  Take 1 tablet (500 mg total) by mouth every 6 (six) hours as needed.     oxyCODONE 15 MG immediate release tablet  Commonly known as:  ROXICODONE  Take  1 tablet (15 mg total) by mouth every 4 (four) hours as needed for pain. Takes one four times a day     Oxycodone HCl 10 MG Tabs  Take 10 mg by mouth 4 (four) times daily.     QUEtiapine 400 MG 24 hr tablet  Commonly known as:  SEROQUEL XR  Take 400 mg by mouth at bedtime.     VENTOLIN HFA 108 (90 BASE) MCG/ACT inhaler  Generic drug:  albuterol  Inhale 2 puffs into the lungs. 2 puffs three times a day as needed     Vitamin D (Ergocalciferol) 50000 UNITS Caps capsule  Commonly known as:  DRISDOL  Take 50,000 Units by mouth every 7 (seven) days. Takes on Monday     zolpidem 12.5 MG CR tablet  Commonly known as:  AMBIEN CR  Take 12.5 mg by mouth at bedtime. sleep        Diagnostic Studies: Dg Chest 2 View  11/11/2012   *RADIOLOGY REPORT*  Clinical Data: Tachycardia  CHEST - 2 VIEW  Comparison: June 03, 2010.  Findings: Cardiomediastinal silhouette appears normal.  No acute pulmonary disease is noted.  Old right rib fractures are noted.  No pleural effusion or pneumothorax is noted.  IMPRESSION: No acute cardiopulmonary abnormality seen.   Original Report Authenticated By: Lupita Raider.,  M.D.   Dg Hip Complete Right  11/19/2012   *RADIOLOGY REPORT*  Clinical Data: Right hip arthroplasty  RIGHT HIP - COMPLETE 2+ VIEW  Comparison: None.  Findings: Two spot films were obtained intraoperatively.  A right total knee replacement is noted.  No acute abnormality is seen.  23 seconds of fluoroscopy was utilized.   Original Report Authenticated By: Alcide Clever, M.D.   Dg Pelvis Portable  11/19/2012   CLINICAL DATA:  47 year old male status post right hip replacement.  EXAM: PORTABLE PELVIS  COMPARISON:  Intraoperative fluoroscopy 0742 hr the same day.  FINDINGS: AP portable view at 0957 hrs. Sequelae of bipolar right hip arthroplasty. Hardware opponents appear intact and normally aligned on this view. Overlying postoperative soft tissue changes. No unexpected osseous changes.  IMPRESSION: Right  hip  arthroplasty with no adverse features.   Electronically Signed   By: Augusto Gamble   On: 11/19/2012 10:23   Dg Hip Portable 1 View Right  11/19/2012   CLINICAL DATA:  47 year old male status post hip replacement.  EXAM: PORTABLE RIGHT HIP - 1 VIEW  COMPARISON:  AP pelvis 0957 hrs. the same day and earlier.  FINDINGS: Portable cross-table lateral view of the right hip 1001 hrs. Bipolar right hip arthroplasty components appear normally aligned. No adverse features identified.  IMPRESSION: Normally aligned of right hip arthroplasty components, no adverse features.   Electronically Signed   By: Augusto Gamble   On: 11/19/2012 10:23   Dg C-arm 1-60 Min-no Report  11/19/2012   CLINICAL DATA: right anterior hip   C-ARM 1-60 MINUTES  Fluoroscopy was utilized by the requesting physician.  No radiographic  interpretation.     Disposition:  To skilled nursing facility      Discharge Orders   Future Orders Complete By Expires   Call MD / Call 911  As directed    Comments:     If you experience chest pain or shortness of breath, CALL 911 and be transported to the hospital emergency room.  If you develope a fever above 101 F, pus (white drainage) or increased drainage or redness at the wound, or calf pain, call your surgeon's office.   Constipation Prevention  As directed    Comments:     Drink plenty of fluids.  Prune juice may be helpful.  You may use a stool softener, such as Colace (over the counter) 100 mg twice a day.  Use MiraLax (over the counter) for constipation as needed.   Diet - low sodium heart healthy  As directed    Discharge instructions  As directed    Comments:     Full weight bearing as tolerated right hip; no hip precautions. Increase activities as comfort allows. Can get the current dressing wet in the shower.  Leave the current dressing on until 8/29, then can remove that dressing and get the actual incision wet in shower. New dry dressing daily starting 8/29   Discharge patient  As  directed    Discharge wound care:  As directed    Comments:     May shower with dressing intact.Keep dressing clean  and intact until Friday then remove dressing and shower. Apply clean dressing after showering   Increase activity slowly as tolerated  As directed    Weight bearing as tolerated  As directed       Follow-up Information   Follow up with Kathryne Hitch, MD. Schedule an appointment as soon as possible for a visit in 2 weeks.   Specialty:  Orthopedic Surgery   Contact information:   [address] [place] [address] [phone]        Signed: Kathryne Hitch 11/22/2012, 7:47 AM

## 2012-11-23 ENCOUNTER — Non-Acute Institutional Stay (SKILLED_NURSING_FACILITY): Payer: Medicare Other | Admitting: Adult Health

## 2012-11-23 DIAGNOSIS — M87059 Idiopathic aseptic necrosis of unspecified femur: Secondary | ICD-10-CM

## 2012-11-23 DIAGNOSIS — I6529 Occlusion and stenosis of unspecified carotid artery: Secondary | ICD-10-CM

## 2012-11-23 DIAGNOSIS — K219 Gastro-esophageal reflux disease without esophagitis: Secondary | ICD-10-CM

## 2012-11-23 DIAGNOSIS — G4700 Insomnia, unspecified: Secondary | ICD-10-CM

## 2012-11-23 DIAGNOSIS — F329 Major depressive disorder, single episode, unspecified: Secondary | ICD-10-CM

## 2012-11-23 DIAGNOSIS — F411 Generalized anxiety disorder: Secondary | ICD-10-CM

## 2012-11-23 DIAGNOSIS — M87051 Idiopathic aseptic necrosis of right femur: Secondary | ICD-10-CM

## 2012-11-23 DIAGNOSIS — F419 Anxiety disorder, unspecified: Secondary | ICD-10-CM

## 2012-11-24 ENCOUNTER — Non-Acute Institutional Stay (SKILLED_NURSING_FACILITY): Payer: Medicare Other | Admitting: Internal Medicine

## 2012-11-24 DIAGNOSIS — I6529 Occlusion and stenosis of unspecified carotid artery: Secondary | ICD-10-CM | POA: Insufficient documentation

## 2012-11-24 DIAGNOSIS — M87051 Idiopathic aseptic necrosis of right femur: Secondary | ICD-10-CM

## 2012-11-24 DIAGNOSIS — K219 Gastro-esophageal reflux disease without esophagitis: Secondary | ICD-10-CM | POA: Insufficient documentation

## 2012-11-24 DIAGNOSIS — M25559 Pain in unspecified hip: Secondary | ICD-10-CM

## 2012-11-24 DIAGNOSIS — D62 Acute posthemorrhagic anemia: Secondary | ICD-10-CM

## 2012-11-24 DIAGNOSIS — M25551 Pain in right hip: Secondary | ICD-10-CM

## 2012-11-24 DIAGNOSIS — G4700 Insomnia, unspecified: Secondary | ICD-10-CM | POA: Insufficient documentation

## 2012-11-24 DIAGNOSIS — M87059 Idiopathic aseptic necrosis of unspecified femur: Secondary | ICD-10-CM

## 2012-11-24 NOTE — Progress Notes (Signed)
Patient ID: [name], male   DOB: 1965-08-21, 47 y.o.   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 He has been admitted for a short-term rehabilitation.   PAST MEDICAL HISTORY : Reviewed.  No changes.  CURRENT MEDICATIONS: Reviewed per [facility]  REVIEW OF SYSTEMS:  GENERAL: no change in appetite, no fatigue, no weight changes, no fever, chills or weakness RESPIRATORY: no cough, SOB, DOE,, wheezing, hemoptysis CARDIAC: no chest pain, edema or palpitations GI: no abdominal pain, diarrhea, constipation, heart burn, nausea or vomiting  PHYSICAL EXAMINATION  VS:  T 97.9        P 89       RR 20       BP 101/66             WT 211 (Lb)  GENERAL: no acute distress, normal body habitus EYES: conjunctivae normal, sclerae normal, normal eye lids NECK: supple, trachea midline, no neck masses, no thyroid tenderness, no thyromegaly LYMPHATICS: no LAN in the neck, no supraclavicular LAN RESPIRATORY: breathing is even & unlabored, BS CTAB CARDIAC: RRR, no murmur,no extra heart sounds, no edema GI: abdomen soft, normal BS, no masses, no tenderness, no hepatomegaly, no splenomegaly PSYCHIATRIC: the patient is alert & oriented to person, affect & behavior appropriate  LABS/RADIOLOGY: 11/22/12 WBC 4.7 hemoglobin 11.3 hematocrit 34 platelet 210 11/20/12 sodium 138 potassium 3.3 BUN 30 creatinine 0.84 glucose 104 calcium 8.7  ASSESSMENT/PLAN:  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

## 2012-11-30 ENCOUNTER — Other Ambulatory Visit: Payer: Self-pay | Admitting: *Deleted

## 2012-11-30 MED ORDER — OXYCODONE HCL 15 MG PO TABS: 15.0000 mg | ORAL_TABLET | ORAL | Status: DC | PRN

## 2012-11-30 MED ORDER — OXYCODONE HCL ER 30 MG PO T12A: 30.0000 mg | EXTENDED_RELEASE_TABLET | Freq: Two times a day (BID) | ORAL | Status: DC

## 2012-12-07 ENCOUNTER — Encounter: Payer: Self-pay | Admitting: Adult Health

## 2012-12-07 ENCOUNTER — Non-Acute Institutional Stay (SKILLED_NURSING_FACILITY): Payer: Medicare Other | Admitting: Adult Health

## 2012-12-07 DIAGNOSIS — F411 Generalized anxiety disorder: Secondary | ICD-10-CM

## 2012-12-07 DIAGNOSIS — F329 Major depressive disorder, single episode, unspecified: Secondary | ICD-10-CM

## 2012-12-07 DIAGNOSIS — K219 Gastro-esophageal reflux disease without esophagitis: Secondary | ICD-10-CM

## 2012-12-07 DIAGNOSIS — I6529 Occlusion and stenosis of unspecified carotid artery: Secondary | ICD-10-CM

## 2012-12-07 DIAGNOSIS — M87051 Idiopathic aseptic necrosis of right femur: Secondary | ICD-10-CM

## 2012-12-07 DIAGNOSIS — M87059 Idiopathic aseptic necrosis of unspecified femur: Secondary | ICD-10-CM

## 2012-12-07 DIAGNOSIS — F419 Anxiety disorder, unspecified: Secondary | ICD-10-CM

## 2012-12-07 DIAGNOSIS — G4700 Insomnia, unspecified: Secondary | ICD-10-CM

## 2012-12-07 NOTE — Progress Notes (Signed)
Patient ID: [name], male   DOB: Mar 06, 1966, 47 y.o.   MRN: [id number]       PROGRESS NOTE  DATE: 12/07/2012   FACILITY: [name]  LEVEL OF CARE: SNF (31)  CHIEF COMPLAINT:  Discharge Visit  HISTORY OF PRESENT ILLNESS: This is a 47 year old male who is for discharge home with home health PT, OT and nursing. 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 Patient was admitted to this facility for short-term rehabilitation. Patient has completed SNF rehabilitation and therapy has cleared the patient for discharge.  Reassessment of ongoing problem(s):  DEPRESSION: The depression remains stable. [name], insomnia, anedhonia or lack of appetite. No complications reported from the medications currently being used. Staff do not report behavioral problems.  GERD: pt's GERD is stable.  Denies ongoing heartburn, abd. Pain, nausea or vomiting.  Currently on a PPI & tolerates it without any adverse reactions.  ANXIETY: The anxiety remains stable. [name] No complications reported from the medications currently being used.   PAST MEDICAL HISTORY : Reviewed.  No changes.   CURRENT MEDICATIONS: Reviewed per [facility]  REVIEW OF SYSTEMS:  GENERAL: no change in appetite, no fatigue, no weight changes, no fever, chills or weakness RESPIRATORY: no cough, SOB, DOE, wheezing, hemoptysis CARDIAC: no chest pain, edema or palpitations GI: no abdominal pain, diarrhea, constipation, heart burn, nausea or vomiting  PHYSICAL EXAMINATION  VS:  T 97.4       P 74       RR 16      BP 104/71      POX 98 %       WT 203.2 (Lb)  GENERAL: no acute distress, normal body habitus NECK: supple, trachea midline, no neck masses, no thyroid tenderness, no thyromegaly LYMPHATICS: no LAN in the neck, no supraclavicular  LAN RESPIRATORY: breathing is even & unlabored, BS CTAB CARDIAC: RRR, no murmur,no extra heart sounds, no edema GI: abdomen soft, normal BS, no masses, no tenderness, no hepatomegaly, no splenomegaly PSYCHIATRIC: the patient is alert & oriented to person, affect & behavior appropriate  LABS/RADIOLOGY: 11/22/12 WBC 4.7 hemoglobin 11.3 hematocrit 34 platelet 210 11/20/12 sodium 138 potassium 3.3 BUN 30 creatinine 0.84 glucose 104 calcium 8.7  ASSESSMENT/PLAN:  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  Patient will receive home health PT and OT.   Total discharge time: Less than 30 minutes Discharge time involved coordination of the discharge process with Child psychotherapist, nursing staff and therapy department. Medical justification for home health services verified.  CPT CODE: 86578

## 2012-12-10 ENCOUNTER — Other Ambulatory Visit: Payer: Self-pay | Admitting: *Deleted

## 2012-12-10 MED ORDER — OXYCODONE HCL 15 MG PO TABS: ORAL_TABLET | ORAL | Status: DC

## 2012-12-27 DIAGNOSIS — M25559 Pain in unspecified hip: Secondary | ICD-10-CM | POA: Insufficient documentation

## 2012-12-27 DIAGNOSIS — D62 Acute posthemorrhagic anemia: Secondary | ICD-10-CM | POA: Insufficient documentation

## 2012-12-27 NOTE — Progress Notes (Signed)
Patient ID: [name], male   DOB: 20-Apr-1965, 47 y.o.   MRN: [id number]        HISTORY & PHYSICAL  DATE: 11/24/2012   FACILITY: [name]  LEVEL OF CARE: SNF (31)  ALLERGIES:  Allergies  Allergen Reactions  . Antihistamines, Chlorpheniramine-Type Other (See Comments)    All antihistamines-Behavior issues  . Prednisone Other (See Comments)    anxiety    CHIEF COMPLAINT:  Manage right hip avascular necrosis, acute blood loss anemia, and GERD.    HISTORY OF PRESENT ILLNESS:  The patient is a 47 year-old, Caucasian male.    RIGHT HIP AVASCULAR NECROSIS:  Patient was having severe, unremitting pain that was affecting sleep, daily activities, and work.  Therefore, he underwent right total hip arthroplasty and tolerated the procedure well.   He is admitted to this facility for short-term rehabilitation.  He is complaining of uncontrolled right hip pain.    ANEMIA: Postoperatively, patient suffered acute blood loss.   The anemia has been stable. The patient denies fatigue, melena or hematochezia. The patient is currently not on iron.   Last hemoglobins are:  11.3, 12.5, 11.9.    GERD: pt's GERD is stable.  Denies ongoing heartburn, abd. Pain, nausea or vomiting.  Currently on a PPI & tolerates it without any adverse reactions.    PAST MEDICAL HISTORY :  Past Medical History  Diagnosis Date  . Anxiety   . Depression   . Insomnia   . Neck pain     pt states he has 3 bulging disks -no surgery  . PTSD (post-traumatic stress disorder)   . Chronic abdominal pain   . Tachycardia     pt states his heart rate may be elevated because of anxiety over planned surgery  . Sleep apnea     has cpap - but does not use-does not know setting  . GERD (gastroesophageal reflux disease)   . Arthritis     avascular necrosis bilateral hips - right hip pain usally hurts worse.   neck pain-has bulging disks in his neck -  . Carotid artery stenosis     "MILD, LESS THAN OR EQUAL TO  39%, LEFT AND RIGHT INTERNAL CAROTID ARTERY STENOSIS" - PER ULTRASOUND REPORT 09/28/12 [facility].    PAST SURGICAL HISTORY: Past Surgical History  Procedure Laterality Date  . Cholecystectomy    . Colonoscopy    . Total hip arthroplasty Right 11/19/2012    Procedure: RIGHT TOTAL HIP ARTHROPLASTY ANTERIOR APPROACH;  Surgeon: Kathryne Hitch, MD;  Location: [facility] ORS;  Service: Orthopedics;  Laterality: Right;    SOCIAL HISTORY:  reports that he has been smoking Cigarettes.  He has a 31 pack-year smoking history. He does not have any smokeless tobacco history on file. He reports that  drinks alcohol. He reports that he does not use illicit drugs.  FAMILY HISTORY:  Family History  Problem Relation Age of Onset  . Diabetes Mother     CURRENT MEDICATIONS: Reviewed per [facility]  REVIEW OF SYSTEMS:  See HPI otherwise 14 point ROS is negative.  PHYSICAL EXAMINATION  VS:  T 97.4       P 91      RR 18      BP 111/62      POX 95%        WT (Lb) 211    GENERAL: no acute distress, normal body habitus EYES: conjunctivae normal, sclerae normal, normal eye lids MOUTH/THROAT: lips without lesions,no  lesions in the mouth,tongue is without lesions,uvula elevates in midline NECK: supple, trachea midline, no neck masses, no thyroid tenderness, no thyromegaly LYMPHATICS: no LAN in the neck, no supraclavicular LAN RESPIRATORY: breathing is even & unlabored, BS CTAB CARDIAC: RRR, no murmur,no extra heart sounds  EDEMA/VARICOSITIES:  +1 bilateral lower extremity edema  ARTERIAL:  pedal pulses nonpalpable   GI:  ABDOMEN: abdomen soft, normal BS, no masses, no tenderness  LIVER/SPLEEN: no hepatomegaly, no splenomegaly MUSCULOSKELETAL: HEAD: normal to inspection & palpation BACK: no kyphosis, scoliosis or spinal processes tenderness EXTREMITIES: LEFT UPPER EXTREMITY: strength decreased, range of motion normal   RIGHT UPPER EXTREMITY:  full range of motion, normal strength & tone LEFT  LOWER EXTREMITY:  full range of motion, normal strength & tone RIGHT LOWER EXTREMITY: strength intact, range of motion not tested due to surgery   PSYCHIATRIC: the patient is alert & oriented to person, affect & behavior appropriate  LABS/RADIOLOGY: Hemoglobin 11.3, otherwise CBC normal.    Potassium 3.3, otherwise BMP normal.    Chest x-ray:  No acute disease.    Right hip x-ray and pelvic x-ray postsurgically showed right hip arthroplasty without adverse features.    ASSESSMENT/PLAN:  Right hip avascular necrosis.  Status post right total hip arthroplasty.  Continue rehabilitation.    Acute blood loss anemia.  Reassess hemoglobin level.    GERD.  Well controlled.     Right hip pain.  Uncontrolled problem.  Start OxyContin 10 mg q.12.  Discontinue oxycodone 15 mg q.4 p.r.n.  We will keep oxycodone 10 mg q.4 routine.    Hypokalemia.  Reassess.    Posttraumatic stress disorder.  Stable.    Check CBC and BMP.    I have reviewed patient's medical records received at admission/from hospitalization.  CPT CODE: 16109

## 2013-02-13 ENCOUNTER — Emergency Department (HOSPITAL_COMMUNITY): Payer: Medicare Other

## 2013-02-13 ENCOUNTER — Emergency Department (HOSPITAL_COMMUNITY)
Admission: EM | Admit: 2013-02-13 | Discharge: 2013-02-13 | Disposition: A | Discharge status: Home / Self Care | Payer: Medicare Other | Attending: Emergency Medicine | Admitting: Emergency Medicine

## 2013-02-13 ENCOUNTER — Encounter (HOSPITAL_COMMUNITY): Payer: Self-pay | Admitting: Emergency Medicine

## 2013-02-13 DIAGNOSIS — F329 Major depressive disorder, single episode, unspecified: Secondary | ICD-10-CM | POA: Insufficient documentation

## 2013-02-13 DIAGNOSIS — G4700 Insomnia, unspecified: Secondary | ICD-10-CM | POA: Insufficient documentation

## 2013-02-13 DIAGNOSIS — Z8679 Personal history of other diseases of the circulatory system: Secondary | ICD-10-CM | POA: Insufficient documentation

## 2013-02-13 DIAGNOSIS — K219 Gastro-esophageal reflux disease without esophagitis: Secondary | ICD-10-CM | POA: Insufficient documentation

## 2013-02-13 DIAGNOSIS — F4310 Post-traumatic stress disorder, unspecified: Secondary | ICD-10-CM | POA: Insufficient documentation

## 2013-02-13 DIAGNOSIS — Z96649 Presence of unspecified artificial hip joint: Secondary | ICD-10-CM | POA: Insufficient documentation

## 2013-02-13 DIAGNOSIS — M25559 Pain in unspecified hip: Secondary | ICD-10-CM

## 2013-02-13 DIAGNOSIS — F411 Generalized anxiety disorder: Secondary | ICD-10-CM | POA: Insufficient documentation

## 2013-02-13 DIAGNOSIS — F17200 Nicotine dependence, unspecified, uncomplicated: Secondary | ICD-10-CM | POA: Insufficient documentation

## 2013-02-13 DIAGNOSIS — R000 Tachycardia, unspecified: Secondary | ICD-10-CM | POA: Insufficient documentation

## 2013-02-13 DIAGNOSIS — F3289 Other specified depressive episodes: Secondary | ICD-10-CM | POA: Insufficient documentation

## 2013-02-13 DIAGNOSIS — Z79899 Other long term (current) drug therapy: Secondary | ICD-10-CM | POA: Insufficient documentation

## 2013-02-13 DIAGNOSIS — G4730 Sleep apnea, unspecified: Secondary | ICD-10-CM | POA: Insufficient documentation

## 2013-02-13 DIAGNOSIS — G8929 Other chronic pain: Secondary | ICD-10-CM | POA: Insufficient documentation

## 2013-02-13 DIAGNOSIS — M1610 Unilateral primary osteoarthritis, unspecified hip: Secondary | ICD-10-CM | POA: Insufficient documentation

## 2013-02-13 DIAGNOSIS — Z7982 Long term (current) use of aspirin: Secondary | ICD-10-CM | POA: Insufficient documentation

## 2013-02-13 LAB — CBC
MCH: 32.5 pg (ref 26.0–34.0)
MCHC: 33.7 g/dL (ref 30.0–36.0)
Platelets: 159 10*3/uL (ref 150–400)
RDW: 14.8 % (ref 11.5–15.5)

## 2013-02-13 LAB — POCT I-STAT, CHEM 8
Calcium, Ion: 1.23 mmol/L (ref 1.12–1.23)
Creatinine, Ser: 1.1 mg/dL (ref 0.50–1.35)
Glucose, Bld: 111 mg/dL — ABNORMAL HIGH (ref 70–99)
HCT: 35 % — ABNORMAL LOW (ref 39.0–52.0)
Hemoglobin: 11.9 g/dL — ABNORMAL LOW (ref 13.0–17.0)
Sodium: 139 mEq/L (ref 135–145)
TCO2: 28 mmol/L (ref 0–100)

## 2013-02-13 MED ORDER — HYDROMORPHONE HCL PF 1 MG/ML IJ SOLN
1.0000 mg | Freq: Once | INTRAMUSCULAR | Status: AC
  Administered 2013-02-13: 1 mg via INTRAVENOUS
  Filled 2013-02-13: qty 1

## 2013-02-13 MED ORDER — IOHEXOL 350 MG/ML SOLN
60.0000 mL | Freq: Once | INTRAVENOUS | Status: AC | PRN
  Administered 2013-02-13: 60 mL via INTRAVENOUS

## 2013-02-13 NOTE — ED Notes (Signed)
The pt is c/o lt hip pain for 4 days.  He needs a hip replacement on that hip and his pain has increased

## 2013-02-13 NOTE — ED Provider Notes (Signed)
CSN: [id number]     Arrival date & time 02/13/13  0425 History   First MD Initiated Contact with Patient 02/13/13 0435     Chief Complaint  Patient presents with  . Hip Pain   (Consider location/radiation/quality/duration/timing/severity/associated sxs/prior Treatment) HPI This patient is a very pleasant middle aged man who has a history of several chronic pain issues. He has chronic back pain and chronic left hip pain secondary to AVN. He is s/p right THA in August 2014 by Dr. Rayburn Ma and he is scheduled to see Dr. Rayburn Ma tomorrow to schedule elective left THA.   He is here with complaints of severe left hip pain which is similar to chronic pain but unbearable and not responding to oxycodone 15mg  q4h. Patient says his pain has been worse for about 36 hrs. He denies any history of recent trauma.   The patient is noted to be tachycardic at 108. Chart review shows that the patient has a history of chronic sinus tachycardia.   Patient is not followed by a pain management specialist. His PCP prescribes all of his medications.   Past Medical History  Diagnosis Date  . Anxiety   . Depression   . Insomnia   . Neck pain     pt states he has 3 bulging disks -no surgery  . PTSD (post-traumatic stress disorder)   . Chronic abdominal pain   . Tachycardia     pt states his heart rate may be elevated because of anxiety over planned surgery  . Sleep apnea     has cpap - but does not use-does not know setting  . GERD (gastroesophageal reflux disease)   . Arthritis     avascular necrosis bilateral hips - right hip pain usally hurts worse.   neck pain-has bulging disks in his neck -  . Carotid artery stenosis     "MILD, LESS THAN OR EQUAL TO 39%, LEFT AND RIGHT INTERNAL CAROTID ARTERY STENOSIS" - PER ULTRASOUND REPORT 09/28/12 [facility].   Past Surgical History  Procedure Laterality Date  . Cholecystectomy    . Colonoscopy    . Total hip arthroplasty Right 11/19/2012   Procedure: RIGHT TOTAL HIP ARTHROPLASTY ANTERIOR APPROACH;  Surgeon: Kathryne Hitch, MD;  Location: [facility];  Service: Orthopedics;  Laterality: Right;   Family History  Problem Relation Age of Onset  . Diabetes Mother    History  Substance Use Topics  . Smoking status: Current Every Day Smoker -- 1.00 packs/day for 31 years    Types: Cigarettes  . Smokeless tobacco: Not on file  . Alcohol Use: Yes     Comment: once a week - a beer---states he no longer uses marijuana and denies use of any other recreational drugs/ office notes obtained from [facility] - last drug screen 10/22/12 positive for marijuana    Review of Systems 10 point ROS obtained and is negative with the exception of sx noted above.   Allergies  Antihistamines, chlorpheniramine-type and Prednisone  Home Medications   Current Outpatient Rx  Name  Route  Sig  Dispense  Refill  . albuterol (VENTOLIN HFA) 108 (90 BASE) MCG/ACT inhaler   Inhalation   Inhale 2 puffs into the lungs. 2 puffs three times a day as needed         . ALPRAZolam (XANAX) 1 MG tablet      Take one tablet by mouth three times daily as needed For anxiety   90 tablet   5   .  aspirin EC 325 MG EC tablet   Oral   Take 1 tablet (325 mg total) by mouth 2 (two) times daily after a meal.   50 tablet   0   . clonazePAM (KLONOPIN) 1 MG tablet      Take one tablet by mouth every night at bedtime for sleep; may repeat times 1 if unable to sleep   30 tablet   5   . esomeprazole (NEXIUM) 40 MG capsule   Oral   Take 40 mg by mouth daily before breakfast. Pt told he can take twice if needed - but always takes one every am         . folic acid (FOLVITE) 1 MG tablet   Oral   Take 1 mg by mouth daily.         . methocarbamol (ROBAXIN) 500 MG tablet   Oral   Take 1 tablet (500 mg total) by mouth every 6 (six) hours as needed.   40 tablet   0   . oxyCODONE (ROXICODONE) 15 MG immediate release tablet      Take one tablet  by mouth every 4 hours as needed for pain   180 tablet   0   . Oxycodone HCl 10 MG TABS   Oral   Take 1 tablet (10 mg total) by mouth 4 (four) times daily.   120 tablet   0   . OxyCODONE HCl ER (OXYCONTIN) 30 MG T12A   Oral   Take 30 mg by mouth every 12 (twelve) hours.   60 each   0   . QUEtiapine (SEROQUEL XR) 400 MG 24 hr tablet   Oral   Take 400 mg by mouth at bedtime.         . Vitamin D, Ergocalciferol, (DRISDOL) 50000 UNITS CAPS   Oral   Take 50,000 Units by mouth every 7 (seven) days. Takes on Monday         . zolpidem (AMBIEN CR) 12.5 MG CR tablet   Oral   Take 1 tablet (12.5 mg total) by mouth at bedtime. sleep   30 tablet   5    BP 124/65  Pulse 124  Temp(Src) 99.2 F (37.3 C) (Oral)  Resp 18  Ht 5\' 10"  (1.778 m)  Wt 215 lb (97.523 kg)  BMI 30.85 kg/m2  SpO2 96% Physical Exam Gen: well developed and well nourished appearing Head: NCAT Eyes: PERL, EOMI Nose: no epistaixis or rhinorrhea Mouth/throat: mucosa is moist and pink Neck: supple, no stridor Lungs: CTA B, no wheezing, rhonchi or rales CV: rapid and regular, no murmur, good extremity pulses  Abd: soft, notender, nondistended Back: no midline ttp Ext: normal to inspection, I am able to range the left hip but with pain during ROM.  Skin: warm and dry, no lesions Neuro: CN ii-xii grossly intact, no focal deficits, normal speech, normal gait.  Psyche; normal affect,  calm and cooperative.   ED Course  Procedures (including critical care time) Labs Review  Results for orders placed during the hospital encounter of 02/13/13 (from the past 24 hour(s))  CBC     Status: Abnormal   Collection Time    02/13/13  5:42 AM      Result Value Range   WBC 5.1  4.0 - 10.5 K/uL   RBC 3.60 (*) 4.22 - 5.81 MIL/uL   Hemoglobin 11.7 (*) 13.0 - 17.0 g/dL   HCT 78.2 (*) 95.6 - 21.3 %   MCV 96.4  78.0 -  100.0 fL   MCH 32.5  26.0 - 34.0 pg   MCHC 33.7  30.0 - 36.0 g/dL   RDW 19.1  47.8 - 29.5 %    Platelets 159  150 - 400 K/uL  POCT I-STAT, CHEM 8     Status: Abnormal   Collection Time    02/13/13  5:46 AM      Result Value Range   Sodium 139  135 - 145 mEq/L   Potassium 3.9  3.5 - 5.1 mEq/L   Chloride 101  96 - 112 mEq/L   BUN 13  6 - 23 mg/dL   Creatinine, Ser 6.21  0.50 - 1.35 mg/dL   Glucose, Bld 308 (*) 70 - 99 mg/dL   Calcium, Ion 6.57  8.46 - 1.23 mmol/L   TCO2 28  0 - 100 mmol/L   Hemoglobin 11.9 (*) 13.0 - 17.0 g/dL   HCT 96.2 (*) 95.2 - 84.1 %     DG Hip Complete Left (Final result)  Result time: 02/13/13 06:27:59    Final result by Rad Results In Interface (02/13/13 06:27:59)    Narrative:   CLINICAL DATA: Generalized left hip pain for 2 days.  EXAM: LEFT HIP - COMPLETE 2+ VIEW  COMPARISON: Left hip radiographs performed 02/16/2010  FINDINGS: There is a heterogeneous pattern of sclerosis within the left femoral head, raising suspicion for underlying avascular necrosis. There is no evidence of cortical collapse at this time. These findings are largely new from the prior study. There has also been interval placement of a right hip prosthesis, noted in expected position, without evidence of loosening or fracture, the distal aspect of the prosthesis is not imaged on this study.  The pubic symphysis and pubic rami are unremarkable in appearance. The sacroiliac joints are within normal limits. The visualized bowel gas pattern is grossly unremarkable.  IMPRESSION: 1. Heterogeneous pattern of sclerosis within the left femoral head raises suspicion for underlying avascular necrosis. No evidence of cortical collapse at this time. 2. No evidence of fracture or dislocation; right hip prosthesis is unremarkable in appearance.   Electronically Signed By: Roanna Raider M.D. On: 02/13/2013 06:27     MDM   Patient with acute excacerbation of chronic hip pain - followed by PCP with whom the patient states he has a pain contract. Treated with dilaudid 1mg  IV  in ED for acute pain. Pending an unremarkable CTA of the chest, the patient is stable for discharge. His case is signed out to Dr. Silverio Lay at change of shift who will follow up on this study and disposition.    Brandt Loosen, MD 02/13/13 [phone]

## 2013-02-13 NOTE — ED Notes (Signed)
Patient transported to CT 

## 2013-02-13 NOTE — ED Provider Notes (Signed)
Care assumed at sign out. Patient has chronic AVM of hips and has ortho f/u and has pain contract with PMD. Tachycardic here, low grade temp. CT angio pending and showed no PE but possible LLL opacity. As per Dr. Lavella Lemons, no signs of pneumonia and has nl WBC count. Will d/c home with ortho and PMD f/u.   Results for orders placed during the hospital encounter of 02/13/13  CBC      Result Value Range   WBC 5.1  4.0 - 10.5 K/uL   RBC 3.60 (*) 4.22 - 5.81 MIL/uL   Hemoglobin 11.7 (*) 13.0 - 17.0 g/dL   HCT 16.1 (*) 09.6 - 04.5 %   MCV 96.4  78.0 - 100.0 fL   MCH 32.5  26.0 - 34.0 pg   MCHC 33.7  30.0 - 36.0 g/dL   RDW 40.9  81.1 - 91.4 %   Platelets 159  150 - 400 K/uL  POCT I-STAT, CHEM 8      Result Value Range   Sodium 139  135 - 145 mEq/L   Potassium 3.9  3.5 - 5.1 mEq/L   Chloride 101  96 - 112 mEq/L   BUN 13  6 - 23 mg/dL   Creatinine, Ser 7.82  0.50 - 1.35 mg/dL   Glucose, Bld 956 (*) 70 - 99 mg/dL   Calcium, Ion 2.13  0.86 - 1.23 mmol/L   TCO2 28  0 - 100 mmol/L   Hemoglobin 11.9 (*) 13.0 - 17.0 g/dL   HCT 57.8 (*) 46.9 - 62.9 %   Dg Hip Complete Left  02/13/2013   CLINICAL DATA:  Generalized left hip pain for 2 days.  EXAM: LEFT HIP - COMPLETE 2+ VIEW  COMPARISON:  Left hip radiographs performed 02/16/2010  FINDINGS: There is a heterogeneous pattern of sclerosis within the left femoral head, raising suspicion for underlying avascular necrosis. There is no evidence of cortical collapse at this time. These findings are largely new from the prior study. There has also been interval placement of a right hip prosthesis, noted in expected position, without evidence of loosening or fracture, the distal aspect of the prosthesis is not imaged on this study.  The pubic symphysis and pubic rami are unremarkable in appearance. The sacroiliac joints are within normal limits. The visualized bowel gas pattern is grossly unremarkable.  IMPRESSION: 1. Heterogeneous pattern of sclerosis within the  left femoral head raises suspicion for underlying avascular necrosis. No evidence of cortical collapse at this time. 2. No evidence of fracture or dislocation; right hip prosthesis is unremarkable in appearance.   Electronically Signed   By: Roanna Raider M.D.   On: 02/13/2013 06:27   Ct Angio Chest Pe W/cm &/or Wo Cm  02/13/2013   CLINICAL DATA:  Acute left pain with known history of AVN. Recent right hip replacement.  EXAM: CT ANGIOGRAPHY CHEST WITH CONTRAST  TECHNIQUE: Multidetector CT imaging of the chest was performed using the standard protocol during bolus administration of intravenous contrast. Multiplanar CT image reconstructions including MIPs were obtained to evaluate the vascular anatomy.  CONTRAST:  60mL OMNIPAQUE IOHEXOL 350 MG/ML SOLN  COMPARISON:  11/11/2012.  FINDINGS: There is adequate opacification of the pulmonary arteries. There is no pulmonary embolus. The main pulmonary artery, right main pulmonary artery and left main pulmonary arteries are normal in size. The heart size is normal. There is no pericardial effusion.  There are scattered areas of ground-glass opacity in the left lower lobe. . There is no focal  consolidation. There is no pleural effusion or pneumothorax.  There is no axillary, hilar, or mediastinal adenopathy.  There is no lytic or blastic osseous lesion.  The visualized portions of the upper abdomen are unremarkable.  Review of the MIP images confirms the above findings.  IMPRESSION: 1.  No CT evidence of pulmonary embolus.  2. Patchy areas of ground-glass opacity in the left lower lobe as can be seen with an infectious or inflammatory etiology.   Electronically Signed   By: Elige Ko   On: 02/13/2013 08:04      Richardean Canal, MD 02/13/13 [phone]

## 2013-02-18 ENCOUNTER — Encounter (HOSPITAL_COMMUNITY): Payer: Self-pay

## 2013-02-18 ENCOUNTER — Encounter (HOSPITAL_COMMUNITY)
Admission: RE | Admit: 2013-02-18 | Discharge: 2013-02-18 | Disposition: A | Discharge status: Home / Self Care | Payer: Medicare Other | Source: Ambulatory Visit | Attending: Orthopaedic Surgery | Admitting: Orthopaedic Surgery

## 2013-02-18 ENCOUNTER — Encounter (HOSPITAL_COMMUNITY): Payer: Self-pay | Admitting: Pharmacy Technician

## 2013-02-18 DIAGNOSIS — Z01818 Encounter for other preprocedural examination: Secondary | ICD-10-CM | POA: Insufficient documentation

## 2013-02-18 DIAGNOSIS — Z01812 Encounter for preprocedural laboratory examination: Secondary | ICD-10-CM | POA: Insufficient documentation

## 2013-02-18 HISTORY — DX: Nicotine dependence, unspecified, uncomplicated: F17.200

## 2013-02-18 HISTORY — DX: Reserved for inherently not codable concepts without codable children: IMO0001

## 2013-02-18 LAB — URINALYSIS, ROUTINE W REFLEX MICROSCOPIC
Bilirubin Urine: NEGATIVE
Hgb urine dipstick: NEGATIVE
Ketones, ur: NEGATIVE mg/dL
Nitrite: NEGATIVE
Protein, ur: NEGATIVE mg/dL
Urobilinogen, UA: 0.2 mg/dL (ref 0.0–1.0)

## 2013-02-18 LAB — APTT: aPTT: 29 seconds (ref 24–37)

## 2013-02-18 NOTE — Patient Instructions (Signed)
[address]  02/18/2013   Your procedure is scheduled on:   02-25-2013  Report to [facility] at     0530   AM .  Call this number if you have problems the morning of surgery: [phone]  Or Presurgical Testing [phone](Waunita Sandstrom)    For Cpap use: Bring mask and tubing only.   Do not eat food:After Midnight.   Take these medicines the morning of surgery with A SIP OF WATER: Nexium. Oxycodone. Use/bring albuterol Inhaler.   Do not wear jewelry, make-up or nail polish.  Do not wear lotions, powders, or perfumes. You may wear deodorant.  Do not shave 12 hours prior to first CHG shower(legs and under arms).(face and neck okay.)  Do not bring valuables to the hospital.  Contacts, dentures or removable bridgework, body piercing, hair pins may not be worn into surgery.  Leave suitcase in the car. After surgery it may be brought to your room.  For patients admitted to the hospital, checkout time is 11:00 AM the day of discharge.   Patients discharged the day of surgery will not be allowed to drive home. Must have responsible person with you x 24 hours once discharged.  Name and phone number of your driver: Harriett Sine -spouse [phone] cell  Special Instructions: CHG(Chlorhedine 4%-"Hibiclens","Betasept","Aplicare") Shower Use Special Wash: see special instructions.(avoid face and genitals)   Please read over the following fact sheets that you were given: MRSA Information, Blood Transfusion fact sheet, Incentive Spirometry Instruction.  Remember : Type/Screen "Blue armbands" - may not be removed once applied(would result in being retested if removed).  Failure to follow these instructions may result in Cancellation of your surgery.   Patient signature_______________________________________________________

## 2013-02-18 NOTE — Progress Notes (Signed)
02-18-13 0900 Pt. Here for PAT appt. Need MD orders entered.W.Nahal Wanless,RN

## 2013-02-18 NOTE — Pre-Procedure Instructions (Addendum)
02-18-13 EKG/ CXR 8'14 -Epic. CT Chest 11'14 -Epic. No MD order entry as of 0930 AM. CBC, BMP done 02-13-13-results with chart.

## 2013-02-21 ENCOUNTER — Other Ambulatory Visit (HOSPITAL_COMMUNITY): Payer: Self-pay | Admitting: Orthopaedic Surgery

## 2013-02-24 NOTE — Anesthesia Preprocedure Evaluation (Addendum)
Anesthesia Evaluation  Patient identified by MRN, date of birth, ID band Patient awake    Reviewed: Allergy & Precautions, H&P , NPO status , Patient's Chart, lab work & pertinent test results  Airway Mallampati: II TM Distance: >3 FB Neck ROM: Full   Comment: Degenerative changes cervical spine 05-02-12 Dental no notable dental hx.    Pulmonary sleep apnea , Current Smoker,  CT angio chest 02-13-13: No PE. Patchy opacity left lower lobe.  He states he had no chest pain nor SOB at that time, but he had severe left hip pain and there was concern of a blood clot. breath sounds clear to auscultation  Pulmonary exam normal       Cardiovascular Exercise Tolerance: Good + Peripheral Vascular Disease negative cardio ROS  Rhythm:Regular Rate:Normal  ECG:11-11-12: Normal.   Neuro/Psych PSYCHIATRIC DISORDERS Anxiety Depression PTSDnegative neurological ROS     GI/Hepatic Neg liver ROS, GERD-  Medicated,  Endo/Other  negative endocrine ROS  Renal/GU negative Renal ROS  negative genitourinary   Musculoskeletal negative musculoskeletal ROS (+)   Abdominal (+) + obese,   Peds negative pediatric ROS (+)  Hematology  (+) anemia ,   Anesthesia Other Findings   Reproductive/Obstetrics negative OB ROS                         Anesthesia Physical Anesthesia Plan  ASA: II  Anesthesia Plan: General   Post-op Pain Management:    Induction: Intravenous  Airway Management Planned: Oral ETT  Additional Equipment:   Intra-op Plan:   Post-operative Plan: Extubation in OR  Informed Consent: I have reviewed the patients History and Physical, chart, labs and discussed the procedure including the risks, benefits and alternatives for the proposed anesthesia with the patient or authorized representative who has indicated his/her understanding and acceptance.   Dental advisory given  Plan Discussed with:  CRNA  Anesthesia Plan Comments:         Anesthesia Quick Evaluation

## 2013-02-25 ENCOUNTER — Inpatient Hospital Stay (HOSPITAL_COMMUNITY): Payer: Medicare Other | Admitting: Anesthesiology

## 2013-02-25 ENCOUNTER — Encounter (HOSPITAL_COMMUNITY)
Admission: RE | Disposition: A | Discharge status: Skilled Nursing Facility | Payer: Self-pay | Source: Ambulatory Visit | Attending: Orthopaedic Surgery

## 2013-02-25 ENCOUNTER — Inpatient Hospital Stay (HOSPITAL_COMMUNITY): Payer: Medicare Other

## 2013-02-25 ENCOUNTER — Encounter (HOSPITAL_COMMUNITY): Payer: Self-pay | Admitting: *Deleted

## 2013-02-25 ENCOUNTER — Encounter (HOSPITAL_COMMUNITY): Payer: Medicare Other | Admitting: Anesthesiology

## 2013-02-25 ENCOUNTER — Inpatient Hospital Stay (HOSPITAL_COMMUNITY)
Admission: RE | Admit: 2013-02-25 | Discharge: 2013-03-01 | DRG: 470 | Disposition: A | Discharge status: Skilled Nursing Facility | Payer: Medicare Other | Source: Ambulatory Visit | Attending: Orthopaedic Surgery | Admitting: Orthopaedic Surgery

## 2013-02-25 DIAGNOSIS — M25552 Pain in left hip: Secondary | ICD-10-CM

## 2013-02-25 DIAGNOSIS — G4730 Sleep apnea, unspecified: Secondary | ICD-10-CM | POA: Diagnosis present

## 2013-02-25 DIAGNOSIS — Z833 Family history of diabetes mellitus: Secondary | ICD-10-CM

## 2013-02-25 DIAGNOSIS — K219 Gastro-esophageal reflux disease without esophagitis: Secondary | ICD-10-CM | POA: Diagnosis present

## 2013-02-25 DIAGNOSIS — Z01812 Encounter for preprocedural laboratory examination: Secondary | ICD-10-CM

## 2013-02-25 DIAGNOSIS — F411 Generalized anxiety disorder: Secondary | ICD-10-CM | POA: Diagnosis present

## 2013-02-25 DIAGNOSIS — Z96649 Presence of unspecified artificial hip joint: Secondary | ICD-10-CM

## 2013-02-25 DIAGNOSIS — G8929 Other chronic pain: Secondary | ICD-10-CM | POA: Diagnosis present

## 2013-02-25 DIAGNOSIS — M87052 Idiopathic aseptic necrosis of left femur: Secondary | ICD-10-CM

## 2013-02-25 DIAGNOSIS — E669 Obesity, unspecified: Secondary | ICD-10-CM | POA: Diagnosis present

## 2013-02-25 DIAGNOSIS — F17200 Nicotine dependence, unspecified, uncomplicated: Secondary | ICD-10-CM | POA: Diagnosis present

## 2013-02-25 DIAGNOSIS — M47812 Spondylosis without myelopathy or radiculopathy, cervical region: Secondary | ICD-10-CM | POA: Diagnosis present

## 2013-02-25 DIAGNOSIS — F3289 Other specified depressive episodes: Secondary | ICD-10-CM | POA: Diagnosis present

## 2013-02-25 DIAGNOSIS — M87059 Idiopathic aseptic necrosis of unspecified femur: Principal | ICD-10-CM | POA: Diagnosis present

## 2013-02-25 DIAGNOSIS — F4310 Post-traumatic stress disorder, unspecified: Secondary | ICD-10-CM | POA: Diagnosis present

## 2013-02-25 DIAGNOSIS — Z6830 Body mass index (BMI) 30.0-30.9, adult: Secondary | ICD-10-CM

## 2013-02-25 DIAGNOSIS — F329 Major depressive disorder, single episode, unspecified: Secondary | ICD-10-CM | POA: Diagnosis present

## 2013-02-25 HISTORY — PX: TOTAL HIP ARTHROPLASTY: SHX124

## 2013-02-25 LAB — TYPE AND SCREEN: ABO/RH(D): AB NEG

## 2013-02-25 SURGERY — ARTHROPLASTY, HIP, TOTAL, ANTERIOR APPROACH
Anesthesia: General | Site: Hip | Laterality: Left | Wound class: Clean

## 2013-02-25 MED ORDER — PROPOFOL 10 MG/ML IV BOLUS
INTRAVENOUS | Status: AC
  Filled 2013-02-25: qty 20

## 2013-02-25 MED ORDER — HYDROMORPHONE HCL PF 1 MG/ML IJ SOLN
INTRAMUSCULAR | Status: AC
  Filled 2013-02-25: qty 1

## 2013-02-25 MED ORDER — PHENOL 1.4 % MT LIQD: 1.0000 | OROMUCOSAL | Status: DC | PRN

## 2013-02-25 MED ORDER — FENTANYL CITRATE 0.05 MG/ML IJ SOLN
INTRAMUSCULAR | Status: AC
  Filled 2013-02-25: qty 2

## 2013-02-25 MED ORDER — MIDAZOLAM HCL 2 MG/2ML IJ SOLN
INTRAMUSCULAR | Status: AC
  Filled 2013-02-25: qty 2

## 2013-02-25 MED ORDER — LIDOCAINE HCL (CARDIAC) 20 MG/ML IV SOLN
INTRAVENOUS | Status: AC
  Filled 2013-02-25: qty 5

## 2013-02-25 MED ORDER — ASPIRIN EC 325 MG PO TBEC
325.0000 mg | DELAYED_RELEASE_TABLET | Freq: Two times a day (BID) | ORAL | Status: DC
  Administered 2013-02-25 – 2013-03-01 (×8): 325 mg via ORAL
  Filled 2013-02-25 (×11): qty 1

## 2013-02-25 MED ORDER — ALPRAZOLAM 1 MG PO TABS: 1.0000 mg | ORAL_TABLET | Freq: Every evening | ORAL | Status: DC | PRN

## 2013-02-25 MED ORDER — ACETAMINOPHEN 325 MG PO TABS: 650.0000 mg | ORAL_TABLET | Freq: Four times a day (QID) | ORAL | Status: DC | PRN

## 2013-02-25 MED ORDER — SODIUM CHLORIDE 0.9 % IR SOLN
Status: DC | PRN
  Administered 2013-02-25: 3000 mL

## 2013-02-25 MED ORDER — OXYCODONE HCL ER 20 MG PO T12A: 20.0000 mg | EXTENDED_RELEASE_TABLET | Freq: Two times a day (BID) | ORAL | Status: DC

## 2013-02-25 MED ORDER — ONDANSETRON HCL 4 MG/2ML IJ SOLN
4.0000 mg | Freq: Four times a day (QID) | INTRAMUSCULAR | Status: DC | PRN
  Administered 2013-02-26: 4 mg via INTRAVENOUS
  Filled 2013-02-25: qty 2

## 2013-02-25 MED ORDER — VITAMIN D (ERGOCALCIFEROL) 1.25 MG (50000 UNIT) PO CAPS
50000.0000 [IU] | ORAL_CAPSULE | ORAL | Status: DC
  Administered 2013-02-28: 50000 [IU] via ORAL
  Filled 2013-02-25: qty 1

## 2013-02-25 MED ORDER — DIPHENHYDRAMINE HCL 12.5 MG/5ML PO ELIX: 12.5000 mg | ORAL_SOLUTION | ORAL | Status: DC | PRN

## 2013-02-25 MED ORDER — HYDROMORPHONE HCL PF 2 MG/ML IJ SOLN
INTRAMUSCULAR | Status: AC
  Filled 2013-02-25: qty 1

## 2013-02-25 MED ORDER — METHOCARBAMOL 100 MG/ML IJ SOLN
500.0000 mg | Freq: Four times a day (QID) | INTRAVENOUS | Status: DC | PRN
  Administered 2013-02-25: 500 mg via INTRAVENOUS
  Filled 2013-02-25: qty 5

## 2013-02-25 MED ORDER — METOCLOPRAMIDE HCL 10 MG PO TABS: 5.0000 mg | ORAL_TABLET | Freq: Three times a day (TID) | ORAL | Status: DC | PRN

## 2013-02-25 MED ORDER — NICOTINE 21 MG/24HR TD PT24
21.0000 mg | MEDICATED_PATCH | Freq: Every day | TRANSDERMAL | Status: DC
  Administered 2013-02-25 – 2013-03-01 (×5): 21 mg via TRANSDERMAL
  Filled 2013-02-25 (×5): qty 1

## 2013-02-25 MED ORDER — DOCUSATE SODIUM 100 MG PO CAPS
100.0000 mg | ORAL_CAPSULE | Freq: Two times a day (BID) | ORAL | Status: DC
  Administered 2013-02-25 – 2013-03-01 (×8): 100 mg via ORAL
  Filled 2013-02-25 (×9): qty 1

## 2013-02-25 MED ORDER — FENTANYL CITRATE 0.05 MG/ML IJ SOLN
25.0000 ug | INTRAMUSCULAR | Status: DC | PRN
  Administered 2013-02-25 (×2): 50 ug via INTRAVENOUS

## 2013-02-25 MED ORDER — ALPRAZOLAM 1 MG PO TABS
1.0000 mg | ORAL_TABLET | Freq: Three times a day (TID) | ORAL | Status: DC | PRN
  Administered 2013-02-25 – 2013-03-01 (×10): 1 mg via ORAL
  Filled 2013-02-25 (×10): qty 1

## 2013-02-25 MED ORDER — LACTATED RINGERS IV SOLN
INTRAVENOUS | Status: DC | PRN
  Administered 2013-02-25 (×2): via INTRAVENOUS

## 2013-02-25 MED ORDER — ROCURONIUM BROMIDE 100 MG/10ML IV SOLN
INTRAVENOUS | Status: DC | PRN
  Administered 2013-02-25: 30 mg via INTRAVENOUS

## 2013-02-25 MED ORDER — LIDOCAINE HCL (CARDIAC) 20 MG/ML IV SOLN
INTRAVENOUS | Status: DC | PRN
  Administered 2013-02-25: 50 mg via INTRAVENOUS

## 2013-02-25 MED ORDER — FOLIC ACID 1 MG PO TABS
1.0000 mg | ORAL_TABLET | Freq: Every day | ORAL | Status: DC
  Administered 2013-02-25 – 2013-03-01 (×5): 1 mg via ORAL
  Filled 2013-02-25 (×5): qty 1

## 2013-02-25 MED ORDER — OXYCODONE HCL 5 MG PO TABS
5.0000 mg | ORAL_TABLET | ORAL | Status: DC | PRN
  Administered 2013-02-25 – 2013-02-26 (×7): 10 mg via ORAL
  Filled 2013-02-25 (×7): qty 2

## 2013-02-25 MED ORDER — MIDAZOLAM HCL 5 MG/5ML IJ SOLN
INTRAMUSCULAR | Status: DC | PRN
  Administered 2013-02-25 (×2): 1 mg via INTRAVENOUS

## 2013-02-25 MED ORDER — SODIUM CHLORIDE 0.9 % IV SOLN
INTRAVENOUS | Status: DC | PRN
  Administered 2013-02-25: 1000 mL via INTRAMUSCULAR

## 2013-02-25 MED ORDER — HYDROMORPHONE HCL PF 2 MG/ML IJ SOLN
INTRAMUSCULAR | Status: AC
  Administered 2013-02-25: 1 mg
  Filled 2013-02-25: qty 1

## 2013-02-25 MED ORDER — PROPOFOL 10 MG/ML IV BOLUS
INTRAVENOUS | Status: DC | PRN
  Administered 2013-02-25: 200 mg via INTRAVENOUS

## 2013-02-25 MED ORDER — NEOSTIGMINE METHYLSULFATE 1 MG/ML IJ SOLN
INTRAMUSCULAR | Status: DC | PRN
  Administered 2013-02-25: 5 mg via INTRAVENOUS

## 2013-02-25 MED ORDER — FENTANYL CITRATE 0.05 MG/ML IJ SOLN
INTRAMUSCULAR | Status: AC
  Filled 2013-02-25: qty 5

## 2013-02-25 MED ORDER — NEOSTIGMINE METHYLSULFATE 1 MG/ML IJ SOLN
INTRAMUSCULAR | Status: AC
  Filled 2013-02-25: qty 10

## 2013-02-25 MED ORDER — DEXAMETHASONE SODIUM PHOSPHATE 10 MG/ML IJ SOLN
INTRAMUSCULAR | Status: AC
  Filled 2013-02-25: qty 1

## 2013-02-25 MED ORDER — ALUM & MAG HYDROXIDE-SIMETH 200-200-20 MG/5ML PO SUSP: 30.0000 mL | ORAL | Status: DC | PRN

## 2013-02-25 MED ORDER — PANTOPRAZOLE SODIUM 40 MG PO TBEC
80.0000 mg | DELAYED_RELEASE_TABLET | Freq: Every day | ORAL | Status: DC
  Administered 2013-02-26 – 2013-03-01 (×4): 80 mg via ORAL
  Filled 2013-02-25 (×4): qty 2

## 2013-02-25 MED ORDER — ALBUTEROL SULFATE HFA 108 (90 BASE) MCG/ACT IN AERS
2.0000 | INHALATION_SPRAY | RESPIRATORY_TRACT | Status: DC | PRN
  Filled 2013-02-25: qty 6.7

## 2013-02-25 MED ORDER — MENTHOL 3 MG MT LOZG: 1.0000 | LOZENGE | OROMUCOSAL | Status: DC | PRN

## 2013-02-25 MED ORDER — CEFAZOLIN SODIUM-DEXTROSE 2-3 GM-% IV SOLR
2.0000 g | INTRAVENOUS | Status: AC
  Administered 2013-02-25: 2 g via INTRAVENOUS

## 2013-02-25 MED ORDER — HYDROMORPHONE HCL PF 1 MG/ML IJ SOLN
0.2500 mg | INTRAMUSCULAR | Status: DC | PRN
  Administered 2013-02-25 (×6): 0.5 mg via INTRAVENOUS

## 2013-02-25 MED ORDER — METHOCARBAMOL 500 MG PO TABS
500.0000 mg | ORAL_TABLET | Freq: Four times a day (QID) | ORAL | Status: DC | PRN
  Administered 2013-02-25 – 2013-03-01 (×10): 500 mg via ORAL
  Filled 2013-02-25 (×10): qty 1

## 2013-02-25 MED ORDER — SUCCINYLCHOLINE CHLORIDE 20 MG/ML IJ SOLN
INTRAMUSCULAR | Status: DC | PRN
  Administered 2013-02-25: 100 mg via INTRAVENOUS

## 2013-02-25 MED ORDER — QUETIAPINE FUMARATE ER 300 MG PO TB24
600.0000 mg | ORAL_TABLET | Freq: Every day | ORAL | Status: DC
  Administered 2013-02-25 – 2013-02-28 (×4): 600 mg via ORAL
  Filled 2013-02-25 (×5): qty 2

## 2013-02-25 MED ORDER — ONDANSETRON HCL 4 MG/2ML IJ SOLN
INTRAMUSCULAR | Status: AC
  Filled 2013-02-25: qty 2

## 2013-02-25 MED ORDER — GLYCOPYRROLATE 0.2 MG/ML IJ SOLN
INTRAMUSCULAR | Status: DC | PRN
  Administered 2013-02-25: 0.6 mg via INTRAVENOUS

## 2013-02-25 MED ORDER — CLONAZEPAM 1 MG PO TABS
1.0000 mg | ORAL_TABLET | Freq: Every day | ORAL | Status: DC
  Administered 2013-02-25 – 2013-02-28 (×4): 1 mg via ORAL
  Filled 2013-02-25 (×4): qty 1

## 2013-02-25 MED ORDER — LACTATED RINGERS IV SOLN: Freq: Once | INTRAVENOUS | Status: DC

## 2013-02-25 MED ORDER — ACETAMINOPHEN 650 MG RE SUPP: 650.0000 mg | Freq: Four times a day (QID) | RECTAL | Status: DC | PRN

## 2013-02-25 MED ORDER — CHOLECALCIFEROL 10 MCG (400 UNIT) PO TABS: 400.0000 [IU] | ORAL_TABLET | ORAL | Status: DC

## 2013-02-25 MED ORDER — TRANEXAMIC ACID 100 MG/ML IV SOLN
1000.0000 mg | INTRAVENOUS | Status: AC
  Administered 2013-02-25: 1000 mg via INTRAVENOUS
  Filled 2013-02-25: qty 10

## 2013-02-25 MED ORDER — HYDROMORPHONE HCL PF 1 MG/ML IJ SOLN
1.0000 mg | INTRAMUSCULAR | Status: DC | PRN
  Administered 2013-02-25 – 2013-02-26 (×13): 1 mg via INTRAVENOUS
  Filled 2013-02-25 (×13): qty 1

## 2013-02-25 MED ORDER — FENTANYL CITRATE 0.05 MG/ML IJ SOLN
INTRAMUSCULAR | Status: DC | PRN
  Administered 2013-02-25: 50 ug via INTRAVENOUS
  Administered 2013-02-25: 100 ug via INTRAVENOUS
  Administered 2013-02-25 (×4): 50 ug via INTRAVENOUS

## 2013-02-25 MED ORDER — SODIUM CHLORIDE 0.9 % IV SOLN
INTRAVENOUS | Status: DC
  Administered 2013-02-25 – 2013-02-27 (×5): via INTRAVENOUS

## 2013-02-25 MED ORDER — CEFAZOLIN SODIUM 1-5 GM-% IV SOLN
1.0000 g | Freq: Four times a day (QID) | INTRAVENOUS | Status: AC
  Administered 2013-02-25 (×2): 1 g via INTRAVENOUS
  Filled 2013-02-25 (×2): qty 50

## 2013-02-25 MED ORDER — METOPROLOL TARTRATE 1 MG/ML IV SOLN
INTRAVENOUS | Status: DC | PRN
  Administered 2013-02-25: 1 mg via INTRAVENOUS

## 2013-02-25 MED ORDER — CEFAZOLIN SODIUM-DEXTROSE 2-3 GM-% IV SOLR
INTRAVENOUS | Status: AC
  Filled 2013-02-25: qty 50

## 2013-02-25 MED ORDER — POLYETHYLENE GLYCOL 3350 17 G PO PACK
17.0000 g | PACK | Freq: Every day | ORAL | Status: DC | PRN
  Filled 2013-02-25: qty 1

## 2013-02-25 MED ORDER — ONDANSETRON HCL 4 MG PO TABS: 4.0000 mg | ORAL_TABLET | Freq: Four times a day (QID) | ORAL | Status: DC | PRN

## 2013-02-25 MED ORDER — PROMETHAZINE HCL 25 MG/ML IJ SOLN: 6.2500 mg | INTRAMUSCULAR | Status: DC | PRN

## 2013-02-25 MED ORDER — ZOLPIDEM TARTRATE 5 MG PO TABS
5.0000 mg | ORAL_TABLET | Freq: Every evening | ORAL | Status: DC | PRN
  Administered 2013-02-25 – 2013-02-28 (×3): 5 mg via ORAL
  Filled 2013-02-25 (×4): qty 1

## 2013-02-25 MED ORDER — HYDROMORPHONE HCL PF 1 MG/ML IJ SOLN
INTRAMUSCULAR | Status: DC | PRN
  Administered 2013-02-25 (×2): 1 mg via INTRAVENOUS

## 2013-02-25 MED ORDER — METOPROLOL TARTRATE 1 MG/ML IV SOLN
INTRAVENOUS | Status: AC
  Filled 2013-02-25: qty 5

## 2013-02-25 MED ORDER — GLYCOPYRROLATE 0.2 MG/ML IJ SOLN
INTRAMUSCULAR | Status: AC
  Filled 2013-02-25: qty 3

## 2013-02-25 MED ORDER — ONDANSETRON HCL 4 MG/2ML IJ SOLN
INTRAMUSCULAR | Status: DC | PRN
  Administered 2013-02-25: 4 mg via INTRAVENOUS

## 2013-02-25 MED ORDER — METOCLOPRAMIDE HCL 5 MG/ML IJ SOLN: 5.0000 mg | Freq: Three times a day (TID) | INTRAMUSCULAR | Status: DC | PRN

## 2013-02-25 SURGICAL SUPPLY — 41 items
ADH SKN CLS APL DERMABOND .7 (GAUZE/BANDAGES/DRESSINGS)
BAG SPEC THK2 15X12 ZIP CLS (MISCELLANEOUS)
BAG ZIPLOCK 12X15 (MISCELLANEOUS) IMPLANT
BLADE SAW SGTL 18X1.27X75 (BLADE) ×2 IMPLANT
CAPT HIP PF COP ×1 IMPLANT
CELLS DAT CNTRL 66122 CELL SVR (MISCELLANEOUS) ×1 IMPLANT
DERMABOND ADVANCED (GAUZE/BANDAGES/DRESSINGS)
DERMABOND ADVANCED .7 DNX12 (GAUZE/BANDAGES/DRESSINGS) IMPLANT
DRAPE C-ARM 42X120 X-RAY (DRAPES) ×2 IMPLANT
DRAPE STERI IOBAN 125X83 (DRAPES) ×2 IMPLANT
DRAPE U-SHAPE 47X51 STRL (DRAPES) ×6 IMPLANT
DRSG AQUACEL AG ADV 3.5X10 (GAUZE/BANDAGES/DRESSINGS) ×2 IMPLANT
DURAPREP 26ML APPLICATOR (WOUND CARE) ×2 IMPLANT
ELECT BLADE TIP CTD 4 INCH (ELECTRODE) ×2 IMPLANT
ELECT REM PT RETURN 9FT ADLT (ELECTROSURGICAL) ×2
ELECTRODE REM PT RTRN 9FT ADLT (ELECTROSURGICAL) ×1 IMPLANT
FACESHIELD LNG OPTICON STERILE (SAFETY) ×8 IMPLANT
GAUZE XEROFORM 5X9 LF (GAUZE/BANDAGES/DRESSINGS) ×2 IMPLANT
GLOVE BIO SURGEON STRL SZ7.5 (GLOVE) ×2 IMPLANT
GLOVE BIOGEL PI IND STRL 8 (GLOVE) ×2 IMPLANT
GLOVE BIOGEL PI INDICATOR 8 (GLOVE) ×2
GLOVE ECLIPSE 8.0 STRL XLNG CF (GLOVE) ×2 IMPLANT
GOWN STRL REIN XL XLG (GOWN DISPOSABLE) ×4 IMPLANT
HANDPIECE INTERPULSE COAX TIP (DISPOSABLE) ×2
KIT BASIN OR (CUSTOM PROCEDURE TRAY) ×2 IMPLANT
PACK TOTAL JOINT (CUSTOM PROCEDURE TRAY) ×2 IMPLANT
PADDING CAST COTTON 6X4 STRL (CAST SUPPLIES) ×2 IMPLANT
RETRACTOR WND ALEXIS 18 MED (MISCELLANEOUS) ×1 IMPLANT
RTRCTR WOUND ALEXIS 18CM MED (MISCELLANEOUS) ×2
SET HNDPC FAN SPRY TIP SCT (DISPOSABLE) ×1 IMPLANT
STAPLER VISISTAT 35W (STAPLE) ×2 IMPLANT
SUT ETHIBOND NAB CT1 #1 30IN (SUTURE) ×2 IMPLANT
SUT ETHILON 3 0 PS 1 (SUTURE) IMPLANT
SUT MNCRL AB 4-0 PS2 18 (SUTURE) IMPLANT
SUT VIC AB 0 CT1 36 (SUTURE) ×2 IMPLANT
SUT VIC AB 1 CT1 36 (SUTURE) ×2 IMPLANT
SUT VIC AB 2-0 CT1 27 (SUTURE) ×4
SUT VIC AB 2-0 CT1 TAPERPNT 27 (SUTURE) ×1 IMPLANT
TOWEL OR 17X26 10 PK STRL BLUE (TOWEL DISPOSABLE) ×2 IMPLANT
TOWEL OR NON WOVEN STRL DISP B (DISPOSABLE) ×2 IMPLANT
TRAY FOLEY CATH 14FRSI W/METER (CATHETERS) ×1 IMPLANT

## 2013-02-25 NOTE — Anesthesia Postprocedure Evaluation (Signed)
  Anesthesia Post-op Note  Patient: [name]  Procedure(s) Performed: Procedure(s) (LRB): LEFT TOTAL HIP ARTHROPLASTY ANTERIOR APPROACH (Left)  Patient Location: PACU  Anesthesia Type: General  Level of Consciousness: awake and alert   Airway and Oxygen Therapy: Patient Spontanous Breathing  Post-op Pain: mild  Post-op Assessment: Post-op Vital signs reviewed, Patient's Cardiovascular Status Stable, Respiratory Function Stable, Patent Airway and No signs of Nausea or vomiting  Last Vitals:  Filed Vitals:   02/25/13 1147  BP: 146/81  Pulse: 98  Temp: 36.6 C  Resp: 14    Post-op Vital Signs: stable   Complications: No apparent anesthesia complications. To floor with OSA precautions.

## 2013-02-25 NOTE — Progress Notes (Signed)
Dr. Magnus Ivan notified of Pelvis X-ray results- made aware of situation with difficulty transmitting lateral x-ray of hip to be read- no more x-rays needed- per Dr. Magnus Ivan

## 2013-02-25 NOTE — Evaluation (Signed)
Physical Therapy Evaluation Patient Details Name: [name] MRN: [id number] DOB: [birth year] Today's Date: 02/25/2013 Time: 1405-1500 PT Time Calculation (min): 55 min  PT Assessment / Plan / Recommendation History of Present Illness  s/p L THA, Aa due to avascular necrosis  Clinical Impression  Pt s/p L THR presents with decreased L LE strength/ROM and post op pain limiting functional mobility.  Pt will benefit from follow up rehab at SNF level to maximize IND prior to return home with limited assist.    PT Assessment  Patient needs continued PT services    Follow Up Recommendations  SNF    Does the patient have the potential to tolerate intense rehabilitation      Barriers to Discharge        Equipment Recommendations  None recommended by PT    Recommendations for Other Services OT consult   Frequency 7X/week    Precautions / Restrictions Precautions Precautions: Fall Restrictions Weight Bearing Restrictions: No LLE Weight Bearing: Weight bearing as tolerated   Pertinent Vitals/Pain 8/10; MEDS requested and received; ice packs provided      Mobility  Bed Mobility Bed Mobility: Supine to Sit;Sit to Supine Supine to Sit: 3: Mod assist Sit to Supine: 3: Mod assist Details for Bed Mobility Assistance: cues for sequence and use of R LE to self assist Transfers Transfers: Sit to Stand;Stand to Sit Sit to Stand: 3: Mod assist Stand to Sit: 3: Mod assist Details for Transfer Assistance: cues for LE management and use of UEs to self assist Ambulation/Gait Ambulation/Gait Assistance: 3: Mod assist Ambulation Distance (Feet): 15 Feet Assistive device: Rolling walker Ambulation/Gait Assistance Details: cues for sequence, posture and position from RW Gait Pattern: Step-to pattern;Decreased step length - right;Decreased step length - left;Shuffle;Antalgic    Exercises Total Joint Exercises Ankle Circles/Pumps: AROM;15 reps;Both;Supine Heel Slides: AAROM;10  reps;Supine;Left Hip ABduction/ADduction: AAROM;Left;10 reps;Supine   PT Diagnosis: Difficulty walking  PT Problem List: Decreased strength;Decreased range of motion;Decreased activity tolerance;Decreased mobility;Decreased knowledge of use of DME;Pain PT Treatment Interventions: DME instruction;Gait training;Stair training;Functional mobility training;Therapeutic activities;Therapeutic exercise     PT Goals(Current goals can be found in the care plan section) Acute Rehab PT Goals Patient Stated Goal: Resume previous lifestyle with decreased pain PT Goal Formulation: With patient Time For Goal Achievement: 03/03/13 Potential to Achieve Goals: Good  Visit Information  Last PT Received On: 02/25/13 Assistance Needed: +2 (chair follow) History of Present Illness: s/p L THA, Aa due to avascular necrosis       Prior Functioning  Home Living Family/patient expects to be discharged to:: Private residence Living Arrangements: Spouse/significant other Additional Comments: Pt's spouse works full time and takes care of elderly parents.  Home is [address] yrs old with many stairs Prior Function Level of Independence: Independent Communication Communication: No difficulties Dominant Hand: Right    Cognition  Cognition Arousal/Alertness: Awake/alert Behavior During Therapy: WFL for tasks assessed/performed Overall Cognitive Status: Within Functional Limits for tasks assessed    Extremity/Trunk Assessment Upper Extremity Assessment Upper Extremity Assessment: Overall WFL for tasks assessed Lower Extremity Assessment Lower Extremity Assessment: LLE deficits/detail LLE Deficits / Details: hip strength 2+/5 with AAROM at hip to 75 flex and 10 abd Cervical / Trunk Assessment Cervical / Trunk Assessment: Normal   Balance    End of Session PT - End of Session Equipment Utilized During Treatment: Gait belt Activity Tolerance: Patient tolerated treatment well Patient left: with call bell/phone  within reach;in bed;with family/visitor present Nurse Communication: Mobility status  GP  Amaliya Whitelaw 02/25/2013, 3:56 PM

## 2013-02-25 NOTE — Progress Notes (Signed)
Pharmacy  I clarified some inconsistencies on [name] home med list. One involved the OxyContin. Apparently he was just recently prescribed this by his PCP to help him sleep through the night but he has only taken a single dose of this medication. I discussed this with the surgeon on call, Dr. Lajoyce Corners and he decided to discontinue OxyContin to avoid oversedation.   Charolotte Eke, PharmD, pager [phone]. 02/25/2013,1:13 PM.

## 2013-02-25 NOTE — H&P (Signed)
TOTAL HIP ADMISSION H&P  Patient is admitted for left total hip arthroplasty.  Subjective:  Chief Complaint: left hip pain  HPI: [name], 47 y.o. male, has a history of pain and functional disability in the left hip(s) due to avascualr necrosis and patient has failed non-surgical conservative treatments for greater than 12 weeks to include NSAID's and/or analgesics, supervised PT with diminished ADL's post treatment, use of assistive devices and activity modification.  Onset of symptoms was gradual starting 3 years ago with rapidlly worsening course since that time.The patient noted no past surgery on the left hip(s).  Patient currently rates pain in the left hip at 10 out of 10 with activity. Patient has worsening of pain with activity and weight bearing, pain that interfers with activities of daily living and pain with passive range of motion. Patient has evidence of subchondral cysts, subchondral sclerosis, periarticular osteophytes and joint space narrowing by imaging studies. This condition presents safety issues increasing the risk of falls.  There is no current active infection.  Patient Active Problem List   Diagnosis Date Noted  . Avascular necrosis of bone of left hip 02/25/2013  . Acute posthemorrhagic anemia 12/27/2012  . Hip pain, acute 12/27/2012  . GERD (gastroesophageal reflux disease) 11/24/2012  . Insomnia 11/24/2012  . Carotid artery stenosis 11/24/2012  . Avascular necrosis of bone of right hip 11/19/2012  . Abdominal pain 12/16/2011  . Chronic pancreatitis 12/16/2011  . Anxiety 12/16/2011  . Depression 12/16/2011   Past Medical History  Diagnosis Date  . Anxiety   . Depression   . Insomnia   . Neck pain     pt states he has 3 bulging disks -no surgery  . PTSD (post-traumatic stress disorder)   . Chronic abdominal pain   . Tachycardia     pt states his heart rate may be elevated because of anxiety over planned surgery  . GERD (gastroesophageal reflux  disease)   . Arthritis     avascular necrosis bilateral hips - right hip pain usally hurts worse.   neck pain-has bulging disks in his neck -  . Carotid artery stenosis     "MILD, LESS THAN OR EQUAL TO 39%, LEFT AND RIGHT INTERNAL CAROTID ARTERY STENOSIS" - PER ULTRASOUND REPORT 09/28/12 [facility].  Marland Kitchen Smoking   . Sleep apnea     has cpap - but does not use-does not know setting-doesnot tolerate mask    Past Surgical History  Procedure Laterality Date  . Cholecystectomy    . Colonoscopy    . Total hip arthroplasty Right 11/19/2012    Procedure: RIGHT TOTAL HIP ARTHROPLASTY ANTERIOR APPROACH;  Surgeon: Kathryne Hitch, MD;  Location: [facility];  Service: Orthopedics;  Laterality: Right;    Prescriptions prior to admission  Medication Sig Dispense Refill  . albuterol (VENTOLIN HFA) 108 (90 BASE) MCG/ACT inhaler Inhale 2 puffs into the lungs every 4 (four) hours as needed for wheezing or shortness of breath. 2 puffs three times a day as needed      . ALPRAZolam (XANAX) 1 MG tablet Take 1 mg by mouth at bedtime as needed for anxiety. Take one tablet by mouth three times daily as needed For anxiety      . cholecalciferol (VITAMIN D) 400 UNITS TABS tablet Take 400 Units by mouth once a week.       . clonazePAM (KLONOPIN) 1 MG tablet Take 1 mg by mouth at bedtime. Take one tablet by mouth every night at bedtime for  sleep; may repeat times 1 if unable to sleep      . esomeprazole (NEXIUM) 40 MG capsule Take 40 mg by mouth daily before breakfast. Pt told he can take twice if needed - but always takes one every am      . folic acid (FOLVITE) 1 MG tablet Take 1 mg by mouth daily.      . nicotine (NICODERM CQ - DOSED IN MG/24 HOURS) 21 mg/24hr patch Place 21 mg onto the skin daily.      . OxyCODONE (OXYCONTIN) 15 mg T12A 12 hr tablet Take 15 mg by mouth every 12 (twelve) hours as needed (pain).      Marland Kitchen oxyCODONE (ROXICODONE) 15 MG immediate release tablet Take 15 mg by mouth every 6 (six)  hours as needed for pain.      Marland Kitchen QUEtiapine (SEROQUEL XR) 400 MG 24 hr tablet Take 600 mg by mouth at bedtime.       . Vitamin D, Ergocalciferol, (DRISDOL) 50000 UNITS CAPS Take 50,000 Units by mouth every 7 (seven) days. Takes on Monday      . zolpidem (AMBIEN CR) 12.5 MG CR tablet Take 1 tablet (12.5 mg total) by mouth at bedtime. sleep  30 tablet  5   Allergies  Allergen Reactions  . Antihistamines, Chlorpheniramine-Type Other (See Comments)    All antihistamines-Behavior issues  . Levaquin [Levofloxacin]     disorientation  . Prednisone Other (See Comments)    anxiety    History  Substance Use Topics  . Smoking status: Current Every Day Smoker -- 1.00 packs/day for 31 years    Types: Cigarettes  . Smokeless tobacco: Not on file  . Alcohol Use: Yes     Comment: once a week - a beer---states he no longer uses marijuana and denies use of any other recreational drugs/ office notes obtained from [facility] - last drug screen 10/22/12 positive for marijuana    Family History  Problem Relation Age of Onset  . Diabetes Mother      Review of Systems  All other systems reviewed and are negative.    Objective:  Physical Exam  Constitutional: He is oriented to person, place, and time. He appears well-developed and well-nourished.  HENT:  Head: Normocephalic and atraumatic.  Eyes: EOM are normal. Pupils are equal, round, and reactive to light.  Neck: Normal range of motion. Neck supple.  Cardiovascular: Normal rate and regular rhythm.   Respiratory: Effort normal and breath sounds normal.  GI: Soft. Bowel sounds are normal.  Musculoskeletal:       Left hip: He exhibits decreased range of motion, decreased strength and bony tenderness.  Neurological: He is alert and oriented to person, place, and time.  Skin: Skin is warm and dry.  Psychiatric: He has a normal mood and affect.    Vital signs in last 24 hours: Temp:  [97.2 F (36.2 C)] 97.2 F (36.2 C) (11/28  0512) Pulse Rate:  [113] 113 (11/28 0512) Resp:  [18] 18 (11/28 0512) BP: (109)/(71) 109/71 mmHg (11/28 0512) SpO2:  [100 %] 100 % (11/28 0512)  Labs:   Estimated body mass index is 30.00 kg/(m^2) as calculated from the following:   Height as of 02/18/13: 5\' 11"  (1.803 m).   Weight as of 02/13/13: 97.523 kg (215 lb).   Imaging Review Plain radiographs demonstrate severe stage 4 avascular necrosis of the left hip(s). The bone quality appears to be good for age and reported activity level.  Assessment/Plan:  AVN, left hip(s)  The patient history, physical examination, clinical judgement of the provider and imaging studies are consistent with end stage degenerative joint disease of the left hip(s) and total hip arthroplasty is deemed medically necessary. The treatment options including medical management, injection therapy, arthroscopy and arthroplasty were discussed at length. The risks and benefits of total hip arthroplasty were presented and reviewed. The risks due to aseptic loosening, infection, stiffness, dislocation/subluxation,  thromboembolic complications and other imponderables were discussed.  The patient acknowledged the explanation, agreed to proceed with the plan and consent was signed. Patient is being admitted for inpatient treatment for surgery, pain control, PT, OT, prophylactic antibiotics, VTE prophylaxis, progressive ambulation and ADL's and discharge planning.The patient is planning to be discharged to skilled nursing facility

## 2013-02-25 NOTE — Preoperative (Signed)
Beta Blockers   Reason not to administer Beta Blockers:Not Applicable 

## 2013-02-25 NOTE — Progress Notes (Signed)
Portable AP Pelvis and Lateral Left Hip X-rays done. 

## 2013-02-25 NOTE — Progress Notes (Signed)
Clinical Social Work Department CLINICAL SOCIAL WORK PLACEMENT NOTE 02/25/2013  Patient:  [name], [name]  Account Number:  [id number] Admit date:  02/25/2013  Clinical Social Worker:  Orpah Greek  Date/time:  02/25/2013 04:24 PM  Clinical Social Work is seeking post-discharge placement for this patient at the following level of care:   SKILLED NURSING   (*CSW will update this form in Epic as items are completed)   02/25/2013  Patient/family provided with [name] Department of Clinical Social Work's list of facilities offering this level of care within the geographic area requested by the patient (or if unable, by the patient's family).  02/25/2013  Patient/family informed of their freedom to choose among providers that offer the needed level of care, that participate in Medicare, Medicaid or managed care program needed by the patient, have an available bed and are willing to accept the patient.  02/25/2013  Patient/family informed of [facility]' ownership interest in [facility], as well as of the fact that they are under no obligation to receive care at this facility.  PASARR submitted to EDS on 02/25/2013 PASARR number received from EDS on 02/25/2013  FL2 transmitted to all facilities in geographic area requested by pt/family on  02/25/2013 FL2 transmitted to all facilities within larger geographic area on   Patient informed that his/her managed care company has contracts with or will negotiate with  certain facilities, including the following:     Patient/family informed of bed offers received:  02/25/2013 Patient chooses bed at [facility] Physician recommends and patient chooses bed at    Patient to be transferred to [facility] on  02/28/2013 Patient to be transferred to facility by   The following physician request were entered in Epic:   Additional Comments:   Unice Bailey, LCSW [facility] Clinical Social Worker cell #:  [phone]

## 2013-02-25 NOTE — Transfer of Care (Signed)
Immediate Anesthesia Transfer of Care Note  Patient: [name]  Procedure(s) Performed: Procedure(s) (LRB): LEFT TOTAL HIP ARTHROPLASTY ANTERIOR APPROACH (Left)  Patient Location: PACU  Anesthesia Type: General  Level of Consciousness: sedated, patient cooperative and responds to stimulation  Airway & Oxygen Therapy: Patient Spontanous Breathing and Patient connected to face mask oxgen  Post-op Assessment: Report given to PACU RN and Post -op Vital signs reviewed and stable  Post vital signs: Reviewed and stable  Complications: No apparent anesthesia complications

## 2013-02-25 NOTE — Progress Notes (Signed)
Clinical Social Work Department BRIEF PSYCHOSOCIAL ASSESSMENT 02/25/2013  Patient:  [name], [name]     Account Number:  [id number]     Admit date:  02/25/2013  Clinical Social Worker:  Orpah Greek  Date/Time:  02/25/2013 04:20 PM  Referred by:  Physician  Date Referred:  02/25/2013 Referred for  SNF Placement   Other Referral:   Interview type:  Family Other interview type:   patient's wife in hallway    PSYCHOSOCIAL DATA Living Status:  WIFE Admitted from facility:   Level of care:   Primary support name:  Oracio Galen (wife) h#: [phone] c#: [phone] Primary support relationship to patient:  SPOUSE Degree of support available:   good    CURRENT CONCERNS Current Concerns  Post-Acute Placement   Other Concerns:    SOCIAL WORK ASSESSMENT / PLAN CSW received consult that patient will need SNF for short-term rehab.   Assessment/plan status:  Information/Referral to [facility] Other assessment/ plan:   Information/referral to community resources:   CSW completed FL2 and faxed information to [facility], per patient's request. Confirmed with Jasmine December @ [facility] that they would be able to take patient on Monday if ready.    PATIENT'S/FAMILY'S RESPONSE TO PLAN OF CARE: Patient's wife informed CSW that patient was recently there for his other hip and had a pleasant experience with [name] therefore, would prefer to return there.         Unice Bailey, LCSW [facility] Clinical Social Worker cell #: [phone]

## 2013-02-25 NOTE — Brief Op Note (Signed)
02/25/2013  9:10 AM  PATIENT:  [name]  47 y.o. male  PRE-OPERATIVE DIAGNOSIS:  Avascular necrosis left hip  POST-OPERATIVE DIAGNOSIS:  Avascular necrosis left hip  PROCEDURE:  Procedure(s): LEFT TOTAL HIP ARTHROPLASTY ANTERIOR APPROACH (Left)  SURGEON:  Surgeon(s) and Role:    * Kathryne Hitch, MD - Primary       Glee Arvin, MD - assistant surgeon  ANESTHESIA:   general  EBL:  Total I/O In: -  Out: 450 [Blood:450]  BLOOD ADMINISTERED:none  DRAINS: none   LOCAL MEDICATIONS USED:  NONE  SPECIMEN:  No Specimen  DISPOSITION OF SPECIMEN:  [name]  COUNTS:  YES  TOURNIQUET:  * No tourniquets in log *  DICTATION: .Other Dictation: Dictation Number [id number]  PLAN OF CARE: Admit to inpatient   PATIENT DISPOSITION:  PACU - hemodynamically stable.   Delay start of Pharmacological VTE agent (>24hrs) due to surgical blood loss or risk of bleeding: no

## 2013-02-25 NOTE — Op Note (Signed)
[name], Brian               ACCOUNT NO.:  [id number]  MEDICAL RECORD NO.:  [id number]  LOCATION:  [facility]                         FACILITY:  [facility]  PHYSICIAN:  Vanita Panda. Magnus Ivan, M.D.DATE OF BIRTH:  July 16, 1965  DATE OF PROCEDURE:  02/25/2013 DATE OF DISCHARGE:                              OPERATIVE REPORT   PREOPERATIVE DIAGNOSIS:  Stage IV avascular necrosis, left hip.  POSTOPERATIVE DIAGNOSIS:  Stage IV avascular necrosis, left hip.  PROCEDURE:  Left total hip arthroplasty using direct anterior approach.  IMPLANTS:  DePuy Sector Gription acetabular component size 52 with apex hole eliminator guide and a single screw, size 36+ 4 neutral polyethylene liner, size 12 Corail femoral component with standard offset, size 36+ 1.5 ceramic hip ball.  SURGEON:  Vanita Panda. Magnus Ivan, M.D.  ASSISTANT SURGEON:  Glee Arvin, MD.  ANESTHESIA:  General.  ANTIBIOTICS:  2 g of IV Ancef.  ESTIMATED BLOOD LOSS:  450 mL.  COMPLICATIONS:  None.  INDICATIONS:  [name] is a 47 year old gentleman with known avascular necrosis involving his both hips.  Several months ago, he underwent a successful right total hip arthroplasty.  The left hip has been hurting him severely and x-ray is now showing collapse of the femoral head.  Due to debilitating pain, he wishes to have left total hip arthroplasty.  He understands the risks of acute blood loss anemia, nerve or vessel injury, fracture, infection, DVT.  He understands the goals are decreased pain, improved mobility, and overall improvement in quality of life.  PROCEDURE DESCRIPTION:  After informed consent was obtained, appropriate left hip was marked.  He was brought to the operating room and general anesthesia was obtained while he was on the stretcher.  Traction boots were placed on both his feet.  He was next placed supine on the HANA fracture table with the perineal post in place and both legs in inline skeletal traction  devices, but no traction applied.  The left operative hip was then prepped and draped with DuraPrep and sterile drapes.  Time- out was called and he was identified as correct patient, correct left hip.  I then made an incision inferior and posterior to the anterior superior iliac spine and carried this obliquely down the leg.  I dissected down to the tensor fascia lata muscle.  The tensor fascia was then divided longitudinally.  I then proceeded with a direct anterior approach to the hip.  A Cobra retractor was placed around the lateral neck and up underneath the rectus femoris.  A Cobra retractor was placed medially.  I cauterized the lateral femoral circumflex vessels and made a capsular incision in an L-type format finding a large joint effusion. I placed Cobra retractors in the hip capsule.  I cauterized the lateral femoral circumflex vessels and then made my femoral neck cut just proximal to the lesser trochanter with an oscillating saw and completed this with an osteotome.  I then placed a corkscrew guide in the femoral head and removed the femoral head in its entirety.  I cleaned the acetabulum of remnants of acetabular labrum and debris in the fovea, I placed a Bent Hohmann along the medial acetabular rim and a Cobra retractor laterally.  I then began reaming from a size 42 reamer in 2 mm increments up to a size 52 with all reamers placed under direct visualization and the last reamer also under direct fluoroscopy so I could obtain my depth of reaming, inclination, and anteversion.  Once I was pleased with that position, I placed a real DePuy Sector Gription acetabular component size 52 and I did decide to place 1 single 30 mm screw.  I then placed the apex hole eliminator guide, cleaned the acetabulum component of debris and then placed the real 36+ 4 neutral polyethylene liner.  Attention was then turned to the femur with the leg externally rotated about 100 degrees, extended and  adducted, I placed a Mueller retractor medially and a Hohmann retractor behind the greater trochanter.  I released the lateral joint capsule and then used a rongeur to lateralize and a box cutting osteotome in the femoral canal. I then began broaching from a size 8 broach using the Corail broaching system up to a size 12.  With the size 12, I used a calcar planer.  I then trialed a standard neck and a 36+ 1.5 hip ball.  We brought the leg back over and up with traction and internal rotation, reduced in the acetabulum, was felt to be stable, and I was pleased with position under direct fluoroscopy.  We then dislocated the hip and removed the trial components.  We then placed the real Corail femoral component size 12 and the real 36+ 1.5 ceramic hip ball and again reduced this and acetabulum was stable.  We then copiously irrigated the soft tissue and joint with normal saline solution using pulsatile lavage.  We closed the joint capsule with interrupted #1 Ethibond suture followed by #1 Vicryl in the tensor fascia, 0-Vicryl in the deep tissue, 2-0 Vicryl in the subcutaneous tissue, and staples on the skin.  Xeroform and an Aquacel dressing was applied.  He was taken off the HANA table, awakened, extubated, and taken to the recovery room in a stable condition.  All final counts were correct.  There were no complications noted.     Vanita Panda. Magnus Ivan, M.D.     CYB/MEDQ  D:  02/25/2013  T:  02/25/2013  Job:  [id number]

## 2013-02-26 LAB — CBC
HCT: 35.6 % — ABNORMAL LOW (ref 39.0–52.0)
Hemoglobin: 11.7 g/dL — ABNORMAL LOW (ref 13.0–17.0)
MCHC: 32.9 g/dL (ref 30.0–36.0)
RBC: 3.76 MIL/uL — ABNORMAL LOW (ref 4.22–5.81)
WBC: 5 10*3/uL (ref 4.0–10.5)

## 2013-02-26 LAB — BASIC METABOLIC PANEL
CO2: 30 mEq/L (ref 19–32)
Calcium: 8.8 mg/dL (ref 8.4–10.5)
GFR calc non Af Amer: >90 mL/min (ref >90)
Glucose, Bld: 118 mg/dL — ABNORMAL HIGH (ref 70–99)
Potassium: 3.7 mEq/L (ref 3.5–5.1)
Sodium: 138 mEq/L (ref 135–145)

## 2013-02-26 MED ORDER — DIPHENHYDRAMINE HCL 12.5 MG/5ML PO ELIX: 12.5000 mg | ORAL_SOLUTION | Freq: Four times a day (QID) | ORAL | Status: DC | PRN

## 2013-02-26 MED ORDER — SODIUM CHLORIDE 0.9 % IJ SOLN: 9.0000 mL | INTRAMUSCULAR | Status: DC | PRN

## 2013-02-26 MED ORDER — ONDANSETRON HCL 4 MG/2ML IJ SOLN: 4.0000 mg | Freq: Four times a day (QID) | INTRAMUSCULAR | Status: DC | PRN

## 2013-02-26 MED ORDER — OXYCODONE HCL ER 10 MG PO T12A
10.0000 mg | EXTENDED_RELEASE_TABLET | Freq: Two times a day (BID) | ORAL | Status: DC
  Administered 2013-02-26: 10 mg via ORAL
  Filled 2013-02-26: qty 1

## 2013-02-26 MED ORDER — HYDROMORPHONE 0.3 MG/ML IV SOLN
INTRAVENOUS | Status: AC
  Administered 2013-02-26: 3.6 mg via INTRAVENOUS
  Administered 2013-02-26: 17:00:00 via INTRAVENOUS
  Administered 2013-02-26: 3 mg via INTRAVENOUS
  Administered 2013-02-27: 4.46 mg via INTRAVENOUS
  Administered 2013-02-27: 17:00:00 via INTRAVENOUS
  Administered 2013-02-27: 3.6 mg via INTRAVENOUS
  Administered 2013-02-27: 3.3 mg via INTRAVENOUS
  Administered 2013-02-27: 9.1 mg via INTRAVENOUS
  Administered 2013-02-27: 2.1 mg via INTRAVENOUS
  Administered 2013-02-27: 10:00:00 via INTRAVENOUS
  Administered 2013-02-28: 3 mg via INTRAVENOUS
  Administered 2013-02-28: 2.61 mg via INTRAVENOUS
  Administered 2013-02-28: 2.1 mg via INTRAVENOUS
  Filled 2013-02-26 (×2): qty 25
  Filled 2013-02-26: qty 175
  Filled 2013-02-26 (×3): qty 25

## 2013-02-26 MED ORDER — NALOXONE HCL 0.4 MG/ML IJ SOLN: 0.4000 mg | INTRAMUSCULAR | Status: DC | PRN

## 2013-02-26 MED ORDER — OXYCODONE HCL 5 MG PO TABS
15.0000 mg | ORAL_TABLET | ORAL | Status: DC | PRN
  Administered 2013-02-26 (×2): 15 mg via ORAL
  Filled 2013-02-26 (×2): qty 3

## 2013-02-26 MED ORDER — DIPHENHYDRAMINE HCL 50 MG/ML IJ SOLN: 12.5000 mg | Freq: Four times a day (QID) | INTRAMUSCULAR | Status: DC | PRN

## 2013-02-26 NOTE — Progress Notes (Signed)
Physical Therapy Treatment Patient Details Name: [name] MRN: [id number] DOB: 1966/01/01 Today's Date: 02/26/2013 Time: 4098-1191 PT Time Calculation (min): 25 min  PT Assessment / Plan / Recommendation  History of Present Illness s/p L THA, Aa due to avascular necrosis   PT Comments   Increased activity tolerance this pm but with increased time required all tasks 2* pain.  Follow Up Recommendations  SNF     Does the patient have the potential to tolerate intense rehabilitation     Barriers to Discharge        Equipment Recommendations  None recommended by PT    Recommendations for Other Services OT consult  Frequency 7X/week   Progress towards PT Goals Progress towards PT goals: Progressing toward goals  Plan Current plan remains appropriate    Precautions / Restrictions Precautions Precautions: Fall Restrictions Weight Bearing Restrictions: No LLE Weight Bearing: Weight bearing as tolerated   Pertinent Vitals/Pain 7/10; premed, ice packs declined    Mobility  Bed Mobility Bed Mobility: Supine to Sit Supine to Sit: 4: Min assist Details for Bed Mobility Assistance: cues for sequence and use of R LE to self assist Transfers Transfers: Sit to Stand;Stand to Sit Sit to Stand: 4: Min assist Stand to Sit: 4: Min assist Details for Transfer Assistance: cues for LE management and use of UEs to self assist Ambulation/Gait Ambulation/Gait Assistance: 4: Min assist Ambulation Distance (Feet): 11 Feet Assistive device: Rolling walker Ambulation/Gait Assistance Details: cues for sequence, foot placement and position from RW.  PHysical assist to initiate step on L Gait Pattern: Step-to pattern;Decreased step length - right;Decreased step length - left;Shuffle;Antalgic Gait velocity: decr    Exercises     PT Diagnosis:    PT Problem List:   PT Treatment Interventions:     PT Goals (current goals can now be found in the care plan section) Acute Rehab PT  Goals Patient Stated Goal: Resume previous lifestyle with decreased pain PT Goal Formulation: With patient Time For Goal Achievement: 03/03/13 Potential to Achieve Goals: Good  Visit Information  Last PT Received On: 02/26/13 Assistance Needed: +2 (chair follow) History of Present Illness: s/p L THA, Aa due to avascular necrosis    Subjective Data  Subjective: I'm doing a little better since they changed my pain medicine Patient Stated Goal: Resume previous lifestyle with decreased pain   Cognition  Cognition Arousal/Alertness: Awake/alert Behavior During Therapy: WFL for tasks assessed/performed Overall Cognitive Status: Within Functional Limits for tasks assessed    Balance     End of Session PT - End of Session Equipment Utilized During Treatment: Gait belt Activity Tolerance: Patient limited by pain Patient left: with call bell/phone within reach;with family/visitor present;in chair Nurse Communication: Patient requests pain meds   GP     [name] 02/26/2013, 3:12 PM

## 2013-02-26 NOTE — Progress Notes (Signed)
Subjective: Pt having enough pain to prevent mobilization - was on 60 oxy qd pre op   Objective: Vital signs in last 24 hours: Temp:  [97.9 F (36.6 C)-99.2 F (37.3 C)] 98.6 F (37 C) (11/29 0525) Pulse Rate:  [73-111] 100 (11/29 0525) Resp:  [13-19] 16 (11/29 0800) BP: (119-146)/(69-90) 122/80 mmHg (11/29 0525) SpO2:  [96 %-100 %] 97 % (11/29 0525) Weight:  [97 kg (213 lb 13.5 oz)] 97 kg (213 lb 13.5 oz) (11/28 1300)  Intake/Output from previous day: 11/28 0701 - 11/29 0700 In: 3690 [P.O.:240; I.V.:3295; IV Piggyback:155] Out: 3500 [Urine:3050; Blood:450] Intake/Output this shift: Total I/O In: -  Out: 750 [Urine:750]  Exam:  Sensation intact distally Intact pulses distally Dorsiflexion/Plantar flexion intact  Labs:  Recent Labs  02/26/13 0519  HGB 11.7*    Recent Labs  02/26/13 0519  WBC 5.0  RBC 3.76*  HCT 35.6*  PLT 250    Recent Labs  02/26/13 0519  NA 138  K 3.7  CL 100  CO2 30  BUN 5*  CREATININE 0.83  GLUCOSE 118*  CALCIUM 8.8   No results found for this basename: LABPT, INR,  in the last 72 hours  Assessment/Plan: Plan oxy 10 er bid - 15 q 4 oxy ir - talked with Gunnar Fusi - more mobilization am   [name] 02/26/2013, 9:40 AM

## 2013-02-26 NOTE — Progress Notes (Signed)
Physical Therapy Treatment Patient Details Name: [name] MRN: [id number] DOB: 05-19-1965 Today's Date: 02/26/2013 Time: 4098-1191 PT Time Calculation (min): 25 min  PT Assessment / Plan / Recommendation  History of Present Illness s/p L THA, Aa due to avascular necrosis   PT Comments   Pt limited this am by poor pain control.  Physician in to discuss and adjusting meds,  Will follow.  Follow Up Recommendations  SNF     Does the patient have the potential to tolerate intense rehabilitation     Barriers to Discharge        Equipment Recommendations  None recommended by PT    Recommendations for Other Services OT consult  Frequency 7X/week   Progress towards PT Goals Progress towards PT goals: Not progressing toward goals - comment (pain not well controlled)  Plan Current plan remains appropriate    Precautions / Restrictions Precautions Precautions: Fall Restrictions Weight Bearing Restrictions: No LLE Weight Bearing: Weight bearing as tolerated   Pertinent Vitals/Pain 10/10; premed, ice packs provided, RN aware    Mobility  Bed Mobility Bed Mobility: Not assessed Transfers Transfers: Not assessed Ambulation/Gait Ambulation/Gait Assistance: Not tested (comment)    Exercises Total Joint Exercises Ankle Circles/Pumps: AROM;15 reps;Both;Supine Heel Slides: AAROM;10 reps;Supine;Left Hip ABduction/ADduction: AAROM;Left;Supine;5 reps   PT Diagnosis:    PT Problem List:   PT Treatment Interventions:     PT Goals (current goals can now be found in the care plan section) Acute Rehab PT Goals Patient Stated Goal: Resume previous lifestyle with decreased pain PT Goal Formulation: With patient Time For Goal Achievement: 03/03/13 Potential to Achieve Goals: Good  Visit Information  Last PT Received On: 02/26/13 Assistance Needed: +2 History of Present Illness: s/p L THA, Aa due to avascular necrosis    Subjective Data  Subjective: I can't do any more because  of the pain Patient Stated Goal: Resume previous lifestyle with decreased pain   Cognition  Cognition Arousal/Alertness: Awake/alert Behavior During Therapy: WFL for tasks assessed/performed Overall Cognitive Status: Within Functional Limits for tasks assessed    Balance     End of Session PT - End of Session Activity Tolerance: Patient limited by pain Patient left: with call bell/phone within reach;in bed;with family/visitor present Nurse Communication: Patient requests pain meds   GP     Deema Juncaj 02/26/2013, 12:38 PM

## 2013-02-26 NOTE — Progress Notes (Signed)
Dr. August Saucer aware via phone pt's pain has elevated to level 10+ per pt. Describes as aching, throbbing, soreness "down to the bone." Pt c/o aching in lt testicle and groin earlier yet no edema or bruising noted. Ice packs used throughout shift. The lowest pain rating around lunch time was 7. Pt reports "much worse now". See new orders received in EPIC for PCA Dilaudid.

## 2013-02-26 NOTE — Progress Notes (Signed)
OT Cancellation Note  Patient Details Name: [name] MRN: [id number] DOB: 1965-07-17   Cancelled Treatment:    Reason Eval/Treat Not Completed: OT screened, no needs identified, will sign off - Pt with planned transfer to SNF on Monday 02/28/13.  Will defer OT eval to SNF.    Indy Prestwood M 02/26/2013, 10:30 AM

## 2013-02-27 LAB — CBC
Hemoglobin: 12.1 g/dL — ABNORMAL LOW (ref 13.0–17.0)
MCH: 31.4 pg (ref 26.0–34.0)
Platelets: 224 10*3/uL (ref 150–400)
RBC: 3.85 MIL/uL — ABNORMAL LOW (ref 4.22–5.81)
RDW: 14.2 % (ref 11.5–15.5)
WBC: 7 10*3/uL (ref 4.0–10.5)

## 2013-02-27 NOTE — Progress Notes (Signed)
Dr August Saucer notified of pt's pulse running in the 120's at times.  Pt is asymptomatic and rate is regular. Bp WNL.

## 2013-02-27 NOTE — Progress Notes (Signed)
Physical Therapy Treatment Patient Details Name: [name] MRN: [id number] DOB: [birth year] Today's Date: 02/27/2013 Time: 4098-1191 PT Time Calculation (min): 25 min  PT Assessment / Plan / Recommendation  History of Present Illness s/p L THA, Aa due to avascular necrosis   PT Comments   PM session amb again then assisted back to bed.  Pt tolerated PT better today due to increased pain meds (IV).    Follow Up Recommendations  SNF     Does the patient have the potential to tolerate intense rehabilitation     Barriers to Discharge        Equipment Recommendations  None recommended by PT    Recommendations for Other Services    Frequency 7X/week   Progress towards PT Goals Progress towards PT goals: Progressing toward goals  Plan      Precautions / Restrictions Precautions Precautions: Fall Restrictions Weight Bearing Restrictions: No LLE Weight Bearing: Weight bearing as tolerated   Pertinent Vitals/Pain  C/o 7/10 hip pain ICE applied    Mobility  Bed Mobility Bed Mobility: Sit to Supine Supine to Sit: 4: Min assist Sit to Supine: 2: Max assist Details for Bed Mobility Assistance: max assist to support B LE up into bed and increased time to position to comfort Transfers Transfers: Sit to Stand;Stand to Sit Sit to Stand: 4: Min assist;From chair/3-in-1 Stand to Sit: 4: Min assist;To bed Details for Transfer Assistance: 50% VC's on proper hand placement to increase self assist and increased time due to pain control Ambulation/Gait Ambulation/Gait Assistance: 4: Min assist Ambulation Distance (Feet): 65 Feet Assistive device: Rolling walker Ambulation/Gait Assistance Details: 25% VC's on proper walker to self distance and 35% physical assist to advance L LE during swing phase of gait.   Increased time to complete task. Gait Pattern: Step-to pattern;Decreased step length - right;Decreased step length - left;Shuffle;Antalgic Gait velocity: decr    PT Goals  (current goals can now be found in the care plan section)    Visit Information  Last PT Received On: 02/27/13 Assistance Needed: +2 History of Present Illness: s/p L THA, Aa due to avascular necrosis    Subjective Data      Cognition       Balance     End of Session PT - End of Session Equipment Utilized During Treatment: Gait belt Activity Tolerance: Patient limited by pain;Patient limited by fatigue Patient left: with call bell/phone within reach;with family/visitor present;in bed Nurse Communication: Patient requests pain meds   Felecia Shelling  PTA [facility]  Acute  Rehab Pager      [phone]

## 2013-02-27 NOTE — Progress Notes (Signed)
Pt looks ands feels better on dilaudid pca llequal Labs stable Mildly tachycardic without SOB or any other sxs Plan for PCA today with oral pain meds to restart tomorrow Dc to [facility] mon or tuesday

## 2013-02-27 NOTE — Progress Notes (Signed)
Physical Therapy Treatment Patient Details Name: [name] MRN: [id number] DOB: 03-Apr-1965 Today's Date: 02/27/2013 Time: 1020-1050 PT Time Calculation (min): 30 min  PT Assessment / Plan / Recommendation  History of Present Illness s/p L THA, Aa due to avascular necrosis   PT Comments   POD # 2 am session.  Assisted pt OOB to amb with increased time due to pain which IS better controlled today with IV meds vs po yesterday.  Increased amb distance and tolerance today.  Positioned in recliner, performed some TE's and applied ICE.   Follow Up Recommendations  SNF     Does the patient have the potential to tolerate intense rehabilitation     Barriers to Discharge        Equipment Recommendations  None recommended by PT    Recommendations for Other Services    Frequency 7X/week   Progress towards PT Goals Progress towards PT goals: Progressing toward goals  Plan      Precautions / Restrictions Precautions Precautions: Fall Restrictions Weight Bearing Restrictions: No LLE Weight Bearing: Weight bearing as tolerated    Pertinent Vitals/Pain C/o 8/10 pain during gait "burning" Pre medicated ICE applied    Mobility  Bed Mobility Bed Mobility: Supine to Sit Supine to Sit: 4: Min assist Details for Bed Mobility Assistance: cues for sequence and use of R LE to self assist plus increased, increased time Transfers Transfers: Sit to Stand;Stand to Sit Sit to Stand: 4: Min assist;From bed Stand to Sit: 4: Min assist;To chair/3-in-1 Details for Transfer Assistance: cues for LE management and use of UEs to self assist plus increased time Ambulation/Gait Ambulation/Gait Assistance: 4: Min assist Ambulation Distance (Feet): 45 Feet Assistive device: Rolling walker Ambulation/Gait Assistance Details: 50% VC's on proper sequencing and and 50% physical assist to advance LE completely  (pt initiates well).  required increased time due to pain but better controlled today due to  change in pain meds to IV vs po. Gait Pattern: Step-to pattern;Decreased step length - right;Decreased step length - left;Shuffle;Antalgic Gait velocity: decr    Exercises  10 reps B ankle pumps 10 reps B knee presses    PT Goals (current goals can now be found in the care plan section)    Visit Information  Last PT Received On: 02/27/13 Assistance Needed: +2 History of Present Illness: s/p L THA, Aa due to avascular necrosis    Subjective Data      Cognition       Balance     End of Session PT - End of Session Equipment Utilized During Treatment: Gait belt Activity Tolerance: Patient limited by pain Patient left: with call bell/phone within reach;with family/visitor present;in chair Nurse Communication: Patient requests pain meds   Felecia Shelling  PTA [facility]  Acute  Rehab Pager      [phone]

## 2013-02-28 LAB — CBC
HCT: 30.4 % — ABNORMAL LOW (ref 39.0–52.0)
Hemoglobin: 10.2 g/dL — ABNORMAL LOW (ref 13.0–17.0)
MCV: 93.5 fL (ref 78.0–100.0)
RDW: 14.3 % (ref 11.5–15.5)
WBC: 4.9 10*3/uL (ref 4.0–10.5)

## 2013-02-28 MED ORDER — OXYCODONE HCL 5 MG PO TABS
15.0000 mg | ORAL_TABLET | Freq: Four times a day (QID) | ORAL | Status: DC | PRN
  Administered 2013-02-28 – 2013-03-01 (×4): 15 mg via ORAL
  Filled 2013-02-28 (×5): qty 3

## 2013-02-28 MED ORDER — OXYCODONE HCL ER 15 MG PO T12A: 15.0000 mg | EXTENDED_RELEASE_TABLET | Freq: Two times a day (BID) | ORAL | Status: DC | PRN

## 2013-02-28 MED ORDER — MORPHINE SULFATE 4 MG/ML IJ SOLN
3.0000 mg | Freq: Four times a day (QID) | INTRAMUSCULAR | Status: DC | PRN
  Administered 2013-02-28 (×2): 3 mg via INTRAVENOUS
  Filled 2013-02-28 (×2): qty 1

## 2013-02-28 MED ORDER — OXYCODONE HCL 15 MG PO TABS: 15.0000 mg | ORAL_TABLET | Freq: Four times a day (QID) | ORAL | Status: DC | PRN

## 2013-02-28 MED ORDER — ASPIRIN 325 MG PO TBEC: 325.0000 mg | DELAYED_RELEASE_TABLET | Freq: Two times a day (BID) | ORAL | Status: DC

## 2013-02-28 MED ORDER — KETOROLAC TROMETHAMINE 30 MG/ML IJ SOLN
30.0000 mg | Freq: Once | INTRAMUSCULAR | Status: AC
  Administered 2013-02-28: 30 mg via INTRAVENOUS
  Filled 2013-02-28: qty 1

## 2013-02-28 MED ORDER — OXYCODONE HCL ER 15 MG PO T12A
15.0000 mg | EXTENDED_RELEASE_TABLET | Freq: Two times a day (BID) | ORAL | Status: DC
  Administered 2013-02-28 – 2013-03-01 (×3): 15 mg via ORAL
  Filled 2013-02-28 (×3): qty 1

## 2013-02-28 MED ORDER — METHOCARBAMOL 500 MG PO TABS: 500.0000 mg | ORAL_TABLET | Freq: Four times a day (QID) | ORAL | Status: AC | PRN

## 2013-02-28 MED ORDER — GABAPENTIN 300 MG PO CAPS
300.0000 mg | ORAL_CAPSULE | Freq: Every day | ORAL | Status: DC
  Administered 2013-02-28: 300 mg via ORAL
  Filled 2013-02-28 (×3): qty 1

## 2013-02-28 NOTE — Progress Notes (Signed)
Physical Therapy Treatment Patient Details Name: [name] MRN: [id number] DOB: 1965/10/08 Today's Date: 02/28/2013 Time: 1191-4782 PT Time Calculation (min): 41 min  PT Assessment / Plan / Recommendation  History of Present Illness s/p L THA, Aa due to avascular necrosis   PT Comments   Assisted pt OOB to amb in hallway with increased time then position in recliner. Pt still having pain control issues and pushed his PCA x 2 plus IV pain meds prior.  Reports 8/10 hip pain with act.   Follow Up Recommendations  SNF     Does the patient have the potential to tolerate intense rehabilitation     Barriers to Discharge        Equipment Recommendations       Recommendations for Other Services    Frequency 7X/week   Progress towards PT Goals Progress towards PT goals: Progressing toward goals  Plan      Precautions / Restrictions Precautions Precautions: Fall Restrictions Weight Bearing Restrictions: No LLE Weight Bearing: Weight bearing as tolerated    Pertinent Vitals/Pain C/o 8/10 hip pain with act    Mobility  Bed Mobility Bed Mobility: Supine to Sit Supine to Sit: 4: Min assist;4: Min guard Details for Bed Mobility Assistance: increased time as pt used his leg lifter to assist L LE off bed Transfers Transfers: Sit to Stand;Stand to Sit Sit to Stand: 4: Min assist;From bed Stand to Sit: 4: Min assist;To chair/3-in-1 Details for Transfer Assistance: 25% VC's on proper hand placement to increase self assist and increased time due to pain control Ambulation/Gait Ambulation/Gait Assistance: 4: Min assist;4: Min guard Ambulation Distance (Feet): 85 Feet Assistive device: Rolling walker Ambulation/Gait Assistance Details: increased time and 25% VC's to increase toe off L LE.   Gait Pattern: Step-to pattern;Decreased step length - right;Decreased step length - left;Shuffle;Antalgic Gait velocity: decr    PT Goals (current goals can now be found in the care plan  section)    Visit Information  Last PT Received On: 02/28/13 Assistance Needed: +1 History of Present Illness: s/p L THA, Aa due to avascular necrosis    Subjective Data      Cognition       Balance     End of Session PT - End of Session Equipment Utilized During Treatment: Gait belt Activity Tolerance: Patient limited by pain;Patient limited by fatigue Patient left: with call bell/phone within reach;with family/visitor present;in chair   Felecia Shelling  PTA [facility]  Acute  Rehab Pager      [phone]

## 2013-02-28 NOTE — Progress Notes (Signed)
Patient c/o pain control with oral pain meds. PCA d/c'd today. Xanax accepted for anxiety and Rexene Edison PA notified via telephone to follow-up for better pain control. New orders placed for toradol IV x1 dose and morphine IV 3mg  q6h prn pain.

## 2013-02-28 NOTE — Social Work (Signed)
[name] has offered patient a bed and we are awaiting d/c- hopeful for tomorrow- updated SNF- CSW will f/u tomorrow for further planning-  Reece Levy, MSW, [employer] [phone]

## 2013-02-28 NOTE — Progress Notes (Signed)
Physical Therapy Treatment Patient Details Name: Filbert Craze MRN: [id number] DOB: 1965/04/18 Today's Date: 02/28/2013 Time: 4098-1191 PT Time Calculation (min): 42 min  PT Assessment / Plan / Recommendation  History of Present Illness s/p L THA, Aa due to avascular necrosis   PT Comments   POD # 3 pm session.  Assisted pt out of recliner to amb in hallway then positioned in recliner to perform THR TE's.  Applied ICE.  Pain control MUCH better today.   Follow Up Recommendations  SNF ([name])     Does the patient have the potential to tolerate intense rehabilitation     Barriers to Discharge        Equipment Recommendations  None recommended by PT    Recommendations for Other Services    Frequency 7X/week   Progress towards PT Goals Progress towards PT goals: Progressing toward goals  Plan      Precautions / Restrictions Precautions Precautions: Fall Restrictions Weight Bearing Restrictions: No LLE Weight Bearing: Weight bearing as tolerated    Pertinent Vitals/Pain C/o 6/10 during act Pre medicated ICE applied    Mobility  Bed Mobility Bed Mobility: Not assessed Supine to Sit: 4: Min assist;4: Min guard Details for Bed Mobility Assistance: Pt OOB in recliner Transfers Transfers: Sit to Stand;Stand to Sit Sit to Stand: 4: Min assist;4: Min guard;From chair/3-in-1 Stand to Sit: 4: Min assist;4: Min guard;To chair/3-in-1 Details for Transfer Assistance: 25% VC's on proper hand placement to increase self assist and increased time due to pain control Ambulation/Gait Ambulation/Gait Assistance: 4: Min assist;4: Min guard Ambulation Distance (Feet): 95 Feet Assistive device: Rolling walker Ambulation/Gait Assistance Details: increased time and < 25% VC's on safety with turns and backward gait Gait Pattern: Step-to pattern;Decreased step length - right;Decreased step length - left;Shuffle;Antalgic Gait velocity: decr    Exercises   Total Hip Replacement  TE's 10 reps ankle pumps 10 reps knee presses 10 reps heel slides 10 reps SAQ's 10 reps ABD Followed by ICE    PT Goals (current goals can now be found in the care plan section)    Visit Information  Last PT Received On: 02/28/13 Assistance Needed: +1 History of Present Illness: s/p L THA, Aa due to avascular necrosis    Subjective Data      Cognition       Balance     End of Session PT - End of Session Equipment Utilized During Treatment: Gait belt Activity Tolerance: Patient tolerated treatment well Patient left: with call bell/phone within reach;with family/visitor present;in chair   Felecia Shelling  PTA [facility]  Acute  Rehab Pager      [phone]

## 2013-02-28 NOTE — Progress Notes (Signed)
Subjective: 3 Days Post-Op Procedure(s) (LRB): LEFT TOTAL HIP ARTHROPLASTY ANTERIOR APPROACH (Left) Patient reports pain as severe.  States he cannot go to SNF today due to the pain and being on PCA. Complaining of burning in his hip.  Objective: Vital signs in last 24 hours: Temp:  [98.3 F (36.8 C)-98.5 F (36.9 C)] 98.3 F (36.8 C) (12/01 0500) Pulse Rate:  [97-104] 97 (12/01 0500) Resp:  [14-18] 15 (12/01 0803) BP: (113-129)/(71-76) 113/71 mmHg (12/01 0500) SpO2:  [93 %-100 %] 98 % (12/01 0803)  Intake/Output from previous day: 11/30 0701 - 12/01 0700 In: 1882.5 [P.O.:240; I.V.:1642.5] Out: 1600 [Urine:1600] Intake/Output this shift:     Recent Labs  02/26/13 0519 02/27/13 0534 02/28/13 0520  HGB 11.7* 12.1* 10.2*    Recent Labs  02/27/13 0534 02/28/13 0520  WBC 7.0 4.9  RBC 3.85* 3.25*  HCT 36.2* 30.4*  PLT 224 219    Recent Labs  02/26/13 0519  NA 138  K 3.7  CL 100  CO2 30  BUN 5*  CREATININE 0.83  GLUCOSE 118*  CALCIUM 8.8   No results found for this basename: LABPT, INR,  in the last 72 hours  Neurologically intact Intact pulses distally Dorsiflexion/Plantar flexion intact Incision: dressing C/D/I  Assessment/Plan: 3 Days Post-Op Procedure(s) (LRB): LEFT TOTAL HIP ARTHROPLASTY ANTERIOR APPROACH (Left) Up with therapy Work on pain control  CLARK, GILBERT 02/28/2013, 8:17 AM

## 2013-02-28 NOTE — Progress Notes (Signed)
Dr Ophelia Charter notified patient continued request for additional pain medications.  No additional pain medications ordered at this time.

## 2013-03-01 MED ORDER — KETOROLAC TROMETHAMINE 30 MG/ML IJ SOLN
INTRAMUSCULAR | Status: AC
  Filled 2013-03-01: qty 1

## 2013-03-01 MED ORDER — KETOROLAC TROMETHAMINE 30 MG/ML IJ SOLN
30.0000 mg | Freq: Once | INTRAMUSCULAR | Status: AC
  Administered 2013-03-01: 30 mg via INTRAVENOUS

## 2013-03-01 MED ORDER — GABAPENTIN 300 MG PO CAPS: 300.0000 mg | ORAL_CAPSULE | Freq: Every day | ORAL | Status: DC

## 2013-03-01 NOTE — Progress Notes (Signed)
Patient ID: [name], male   DOB: 1966/03/10, 47 y.o.   MRN: [id number] Doing well.  VS stable.  Discharge to SNF today.

## 2013-03-01 NOTE — Progress Notes (Signed)
Pt discharged to [facility]. Ambulance transported. IV discontinued. Discharge teaching reviewed anmd pt verbalized understanding. Dressing intact.

## 2013-03-01 NOTE — Progress Notes (Signed)
Physical Therapy Treatment Patient Details Name: [name] MRN: [id number] DOB: 1965-11-22 Today's Date: 03/01/2013 Time: 4098-1191 PT Time Calculation (min): 43 min  PT Assessment / Plan / Recommendation  History of Present Illness s/p L THA, Aa due to avascular necrosis   PT Comments   POD #4 Pt OOB in recliner.  Amb in hallway then performed TE's.  Pt required increased time due to pain.    Follow Up Recommendations  SNF [facility])     Does the patient have the potential to tolerate intense rehabilitation     Barriers to Discharge        Equipment Recommendations  None recommended by PT    Recommendations for Other Services    Frequency 7X/week   Progress towards PT Goals Progress towards PT goals: Progressing toward goals  Plan      Precautions / Restrictions Precautions Precautions: Fall Restrictions Weight Bearing Restrictions: No LLE Weight Bearing: Weight bearing as tolerated    Pertinent Vitals/Pain C/o 7/10 pain with act ICE applied    Mobility  Bed Mobility Bed Mobility: Not assessed Details for Bed Mobility Assistance: Pt OOB in recliner Transfers Transfers: Sit to Stand;Stand to Sit Sit to Stand: 4: Min guard;From chair/3-in-1 Stand to Sit: 4: Min guard;To chair/3-in-1 Details for Transfer Assistance: 25% VC's on proper hand placement to increase self assist and increased time due to pain control Ambulation/Gait Ambulation/Gait Assistance: 4: Min guard Ambulation Distance (Feet): 72 Feet Assistive device: Rolling walker Ambulation/Gait Assistance Details: decreased amb distance this am due to MAX c/o fatigue. Gait Pattern: Step-to pattern;Decreased step length - right;Decreased step length - left;Shuffle;Antalgic Gait velocity: decr    Exercises   Total Hip Replacement TE's 10 reps ankle pumps 10 reps knee presses 10 reps heel slides 10 reps SAQ's 10 reps ABD Followed by ICE    PT Goals (current goals can now be found in the care plan  section)    Visit Information  Last PT Received On: 03/01/13 Assistance Needed: +1 History of Present Illness: s/p L THA, Aa due to avascular necrosis    Subjective Data      Cognition       Balance     End of Session PT - End of Session Equipment Utilized During Treatment: Gait belt Activity Tolerance: Patient tolerated treatment well Patient left: with call bell/phone within reach;with family/visitor present;in chair Nurse Communication: Patient requests pain meds   Felecia Shelling  PTA [facility]  Acute  Rehab Pager      [phone]

## 2013-03-01 NOTE — Discharge Summary (Signed)
Patient ID: [name] MRN: [id number] DOB/AGE: April 26, 1965 47 y.o.  Admit date: 02/25/2013 Discharge date: 03/01/2013  Admission Diagnoses:  Principal Problem:   Avascular necrosis of bone of left hip Active Problems:   Status post THR (total hip replacement)   Discharge Diagnoses:  Same  Past Medical History  Diagnosis Date  . Anxiety   . Depression   . Insomnia   . Neck pain     pt states he has 3 bulging disks -no surgery  . PTSD (post-traumatic stress disorder)   . Chronic abdominal pain   . Tachycardia     pt states his heart rate may be elevated because of anxiety over planned surgery  . GERD (gastroesophageal reflux disease)   . Arthritis     avascular necrosis bilateral hips - right hip pain usally hurts worse.   neck pain-has bulging disks in his neck -  . Carotid artery stenosis     "MILD, LESS THAN OR EQUAL TO 39%, LEFT AND RIGHT INTERNAL CAROTID ARTERY STENOSIS" - PER ULTRASOUND REPORT 09/28/12 [facility].  Marland Kitchen Smoking   . Sleep apnea     has cpap - but does not use-does not know setting-doesnot tolerate mask    Surgeries: Procedure(s): LEFT TOTAL HIP ARTHROPLASTY ANTERIOR APPROACH on 02/25/2013   Consultants:    Discharged Condition: Improved  Hospital Course: Jadarion Halbig is an 47 y.o. male who was admitted 02/25/2013 for operative treatment ofAvascular necrosis of bone of left hip. Patient has severe unremitting pain that affects sleep, daily activities, and work/hobbies. After pre-op clearance the patient was taken to the operating room on 02/25/2013 and underwent  Procedure(s): LEFT TOTAL HIP ARTHROPLASTY ANTERIOR APPROACH.    Patient was given perioperative antibiotics: Anti-infectives   Start     Dose/Rate Route Frequency Ordered Stop   02/25/13 1400  ceFAZolin (ANCEF) IVPB 1 g/50 mL premix     1 g 100 mL/hr over 30 Minutes Intravenous Every 6 hours 02/25/13 1210 02/25/13 2021   02/25/13 0512  ceFAZolin (ANCEF) IVPB 2 g/50 mL premix      2 g 100 mL/hr over 30 Minutes Intravenous On call to O.R. 02/25/13 0512 02/25/13 0730       Patient was given sequential compression devices, early ambulation, and chemoprophylaxis to prevent DVT.  Patient benefited maximally from hospital stay and there were no complications.    Recent vital signs: Patient Vitals for the past 24 hrs:  BP Temp Temp src Pulse Resp SpO2  03/01/13 0538 103/66 mmHg 98 F (36.7 C) Oral 80 18 -  03/01/13 0400 - - - - 18 -  02/28/13 2308 - - - - 18 -  02/28/13 2133 116/72 mmHg 98.3 F (36.8 C) Oral 92 18 96 %  02/28/13 2000 - - - - 18 96 %  02/28/13 1400 105/71 mmHg 98.3 F (36.8 C) Oral 95 16 98 %  02/28/13 0958 - - - - 16 -  02/28/13 0858 111/70 mmHg 98.1 F (36.7 C) Oral 94 16 98 %  02/28/13 0803 - - - - 15 98 %     Recent laboratory studies:  Recent Labs  02/27/13 0534 02/28/13 0520  WBC 7.0 4.9  HGB 12.1* 10.2*  HCT 36.2* 30.4*  PLT 224 219     Discharge Medications:     Medication List         albuterol 108 (90 BASE) MCG/ACT inhaler  Commonly known as:  PROVENTIL HFA;VENTOLIN HFA  Inhale 2 puffs into the lungs  every 4 (four) hours as needed for wheezing or shortness of breath.     ALPRAZolam 1 MG tablet  Commonly known as:  XANAX  Take 1 mg by mouth 3 (three) times daily as needed for anxiety.     aspirin 325 MG EC tablet  Take 1 tablet (325 mg total) by mouth 2 (two) times daily after a meal.     clonazePAM 1 MG tablet  Commonly known as:  KLONOPIN  Take 1 mg by mouth at bedtime.     esomeprazole 40 MG capsule  Commonly known as:  NEXIUM  Take 40 mg by mouth daily before breakfast. Pt told he can take twice if needed - but always takes one every am     folic acid 1 MG tablet  Commonly known as:  FOLVITE  Take 1 mg by mouth daily.     gabapentin 300 MG capsule  Commonly known as:  NEURONTIN  Take 1 capsule (300 mg total) by mouth at bedtime.     methocarbamol 500 MG tablet  Commonly known as:  ROBAXIN  Take 1  tablet (500 mg total) by mouth every 6 (six) hours as needed for muscle spasms.     nicotine 21 mg/24hr patch  Commonly known as:  NICODERM CQ - dosed in mg/24 hours  Place 21 mg onto the skin daily.     OxyCODONE 15 mg T12a 12 hr tablet  Commonly known as:  OXYCONTIN  Take 1 tablet (15 mg total) by mouth every 12 (twelve) hours as needed (pain).     oxyCODONE 15 MG immediate release tablet  Commonly known as:  ROXICODONE  Take 1 tablet (15 mg total) by mouth every 6 (six) hours as needed for pain.     QUEtiapine 200 MG 24 hr tablet  Commonly known as:  SEROQUEL XR  Take 200 mg by mouth at bedtime. Takes with 400mg  for a total of 600mg  at bedtime.     QUEtiapine 400 MG 24 hr tablet  Commonly known as:  SEROQUEL XR  Take 400 mg by mouth at bedtime. Takes with 400mg  for a total of 600mg  at bedtime.     Vitamin D (Ergocalciferol) 50000 UNITS Caps capsule  Commonly known as:  DRISDOL  Take 50,000 Units by mouth every 7 (seven) days. Takes on Monday     zolpidem 12.5 MG CR tablet  Commonly known as:  AMBIEN CR  Take 1 tablet (12.5 mg total) by mouth at bedtime. sleep        Diagnostic Studies: Dg Hip Complete Left  02/25/2013   CLINICAL DATA:  Postop left hip replacement.  EXAM: LEFT HIP - COMPLETE 2+ VIEW  COMPARISON:  02/13/2013  FINDINGS: 2 intraoperative spot images demonstrate remote changes of right hip replacement and new changes of left hip replacement. Normal AP alignment. No visible hardware complicating feature.  IMPRESSION: Interval left hip replacement.  No visible complicating feature.   Electronically Signed   By: Charlett Nose M.D.   On: 02/25/2013 09:28   Dg Hip Complete Left  02/13/2013   CLINICAL DATA:  Generalized left hip pain for 2 days.  EXAM: LEFT HIP - COMPLETE 2+ VIEW  COMPARISON:  Left hip radiographs performed 02/16/2010  FINDINGS: There is a heterogeneous pattern of sclerosis within the left femoral head, raising suspicion for underlying avascular  necrosis. There is no evidence of cortical collapse at this time. These findings are largely new from the prior study. There has also been interval placement  of a right hip prosthesis, noted in expected position, without evidence of loosening or fracture, the distal aspect of the prosthesis is not imaged on this study.  The pubic symphysis and pubic rami are unremarkable in appearance. The sacroiliac joints are within normal limits. The visualized bowel gas pattern is grossly unremarkable.  IMPRESSION: 1. Heterogeneous pattern of sclerosis within the left femoral head raises suspicion for underlying avascular necrosis. No evidence of cortical collapse at this time. 2. No evidence of fracture or dislocation; right hip prosthesis is unremarkable in appearance.   Electronically Signed   By: Roanna Raider M.D.   On: 02/13/2013 06:27   Ct Angio Chest Pe W/cm &/or Wo Cm  02/13/2013   CLINICAL DATA:  Acute left pain with known history of AVN. Recent right hip replacement.  EXAM: CT ANGIOGRAPHY CHEST WITH CONTRAST  TECHNIQUE: Multidetector CT imaging of the chest was performed using the standard protocol during bolus administration of intravenous contrast. Multiplanar CT image reconstructions including MIPs were obtained to evaluate the vascular anatomy.  CONTRAST:  60mL OMNIPAQUE IOHEXOL 350 MG/ML SOLN  COMPARISON:  11/11/2012.  FINDINGS: There is adequate opacification of the pulmonary arteries. There is no pulmonary embolus. The main pulmonary artery, right main pulmonary artery and left main pulmonary arteries are normal in size. The heart size is normal. There is no pericardial effusion.  There are scattered areas of ground-glass opacity in the left lower lobe. . There is no focal consolidation. There is no pleural effusion or pneumothorax.  There is no axillary, hilar, or mediastinal adenopathy.  There is no lytic or blastic osseous lesion.  The visualized portions of the upper abdomen are unremarkable.  Review  of the MIP images confirms the above findings.  IMPRESSION: 1.  No CT evidence of pulmonary embolus.  2. Patchy areas of ground-glass opacity in the left lower lobe as can be seen with an infectious or inflammatory etiology.   Electronically Signed   By: Elige Ko   On: 02/13/2013 08:04   Dg Pelvis Portable  02/25/2013   CLINICAL DATA:  Status post left hip replacement  EXAM: PORTABLE PELVIS 1-2 VIEWS  COMPARISON:  11/19/2012  FINDINGS: A left hip replacement is now noted. No acute abnormality is seen. The pelvic ring is intact.   Electronically Signed   By: Alcide Clever M.D.   On: 02/25/2013 10:03    Disposition: to skilled nursing      Discharge Orders   Future Orders Complete By Expires   Call MD / Call 911  As directed    Comments:     If you experience chest pain or shortness of breath, CALL 911 and be transported to the hospital emergency room.  If you develope a fever above 101 F, pus (white drainage) or increased drainage or redness at the wound, or calf pain, call your surgeon's office.   Constipation Prevention  As directed    Comments:     Drink plenty of fluids.  Prune juice may be helpful.  You may use a stool softener, such as Colace (over the counter) 100 mg twice a day.  Use MiraLax (over the counter) for constipation as needed.   Diet - low sodium heart healthy  As directed    Discharge instructions  As directed    Comments:     Full weight left hip; no hip precautions. Can get current dressing wet in the shower. Remove current dressing 03/05/13 and then can get actual incision wet daily; then  new dry dressing daily   Discharge patient  As directed    Discharge wound care:  As directed    Comments:     Keep dressing clean and intact. May shower with dressing intact . Remove dressing Thursday and shower. After showering apply clean dressing.   Increase activity slowly as tolerated  As directed    Weight bearing as tolerated  As directed    Questions:     Laterality:      Extremity:        Follow-up Information   Follow up with Kathryne Hitch, MD In 2 weeks.   Specialty:  Orthopedic Surgery   Contact information:   [address] [place] [address] [phone]        Signed: Kathryne Hitch 03/01/2013, 7:43 AM

## 2013-03-01 NOTE — Progress Notes (Signed)
Clinical Social Work Department CLINICAL SOCIAL WORK PLACEMENT NOTE 03/01/2013  Patient:  [name], [name]  Account Number:  [id number] Admit date:  02/25/2013  Clinical Social Worker:  Orpah Greek  Date/time:  02/25/2013 04:24 PM  Clinical Social Work is seeking post-discharge placement for this patient at the following level of care:   SKILLED NURSING   (*CSW will update this form in Epic as items are completed)   02/25/2013  Patient/family provided with [name] Department of Clinical Social Work's list of facilities offering this level of care within the geographic area requested by the patient (or if unable, by the patient's family).  02/25/2013  Patient/family informed of their freedom to choose among providers that offer the needed level of care, that participate in Medicare, Medicaid or managed care program needed by the patient, have an available bed and are willing to accept the patient.  02/25/2013  Patient/family informed of [facility]' ownership interest in [facility], as well as of the fact that they are under no obligation to receive care at this facility.  PASARR submitted to EDS on 02/25/2013 PASARR number received from EDS on 02/25/2013  FL2 transmitted to all facilities in geographic area requested by pt/family on  02/25/2013 FL2 transmitted to all facilities within larger geographic area on   Patient informed that his/her managed care company has contracts with or will negotiate with  certain facilities, including the following:     Patient/family informed of bed offers received:  02/25/2013 Patient chooses bed at [facility] Physician recommends and patient chooses bed at    Patient to be transferred to [facility] on  02/28/2013 Patient to be transferred to facility by [facility]  The following physician request were entered in Epic:   Additional Comments: Pt / spouse are aware that insurance may not cover ambulance transport .  Cori Razor LCSW [phone]

## 2013-03-01 NOTE — Progress Notes (Signed)
Dr Ophelia Charter paged again at patient insistence.  He states he has a pain level of 9 and he absolutely needs more pain medication.  Discussed with Dr Ophelia Charter.  Patient to receive a one time dose of Tordal but will not be given any additional medications.  Patient will have to discuss with Dr Magnus Ivan in the morning

## 2013-03-02 ENCOUNTER — Other Ambulatory Visit: Payer: Self-pay | Admitting: *Deleted

## 2013-03-02 ENCOUNTER — Non-Acute Institutional Stay (SKILLED_NURSING_FACILITY): Payer: Medicare Other | Admitting: Internal Medicine

## 2013-03-02 DIAGNOSIS — D62 Acute posthemorrhagic anemia: Secondary | ICD-10-CM

## 2013-03-02 DIAGNOSIS — K219 Gastro-esophageal reflux disease without esophagitis: Secondary | ICD-10-CM

## 2013-03-02 DIAGNOSIS — M87052 Idiopathic aseptic necrosis of left femur: Secondary | ICD-10-CM

## 2013-03-02 DIAGNOSIS — M87059 Idiopathic aseptic necrosis of unspecified femur: Secondary | ICD-10-CM

## 2013-03-02 DIAGNOSIS — F17200 Nicotine dependence, unspecified, uncomplicated: Secondary | ICD-10-CM

## 2013-03-02 MED ORDER — OXYCODONE HCL ER 30 MG PO T12A: EXTENDED_RELEASE_TABLET | ORAL | Status: DC

## 2013-03-02 MED ORDER — ALPRAZOLAM 1 MG PO TABS: 1.0000 mg | ORAL_TABLET | Freq: Three times a day (TID) | ORAL | Status: DC | PRN

## 2013-03-04 ENCOUNTER — Other Ambulatory Visit: Payer: Self-pay | Admitting: *Deleted

## 2013-03-04 MED ORDER — OXYCODONE HCL ER 15 MG PO T12A: EXTENDED_RELEASE_TABLET | ORAL | Status: DC

## 2013-03-07 ENCOUNTER — Non-Acute Institutional Stay (SKILLED_NURSING_FACILITY): Payer: Medicare Other | Admitting: Adult Health

## 2013-03-07 ENCOUNTER — Other Ambulatory Visit: Payer: Self-pay | Admitting: *Deleted

## 2013-03-07 DIAGNOSIS — F411 Generalized anxiety disorder: Secondary | ICD-10-CM

## 2013-03-07 DIAGNOSIS — F329 Major depressive disorder, single episode, unspecified: Secondary | ICD-10-CM

## 2013-03-07 DIAGNOSIS — M25552 Pain in left hip: Secondary | ICD-10-CM

## 2013-03-07 DIAGNOSIS — M87059 Idiopathic aseptic necrosis of unspecified femur: Secondary | ICD-10-CM

## 2013-03-07 DIAGNOSIS — M25559 Pain in unspecified hip: Secondary | ICD-10-CM

## 2013-03-07 DIAGNOSIS — K219 Gastro-esophageal reflux disease without esophagitis: Secondary | ICD-10-CM

## 2013-03-07 DIAGNOSIS — G4700 Insomnia, unspecified: Secondary | ICD-10-CM

## 2013-03-07 DIAGNOSIS — F419 Anxiety disorder, unspecified: Secondary | ICD-10-CM

## 2013-03-07 DIAGNOSIS — M87052 Idiopathic aseptic necrosis of left femur: Secondary | ICD-10-CM

## 2013-03-07 MED ORDER — OXYCODONE HCL ER 15 MG PO T12A: EXTENDED_RELEASE_TABLET | ORAL | Status: DC

## 2013-03-08 DIAGNOSIS — M25552 Pain in left hip: Secondary | ICD-10-CM | POA: Insufficient documentation

## 2013-03-08 NOTE — Progress Notes (Signed)
Patient ID: [name], male   DOB: [birth year], 47 y.o.   MRN: [id number]              PROGRESS NOTE  DATE: 12/08/2014e   FACILITY: [facility]  LEVEL OF CARE: SNF (31)  Acute Visit  CHIEF COMPLAINT:  Discharge Note  HISTORY OF PRESENT ILLNESS: This is a 47 year old male who is for discharge home with Home health PT, OT and Nursing. He has been admitted to [facility] on 03/01/13 from [facility] with Avascular Necrosis of bone of left hip S/P Left total hip arthroplasty. Patient was admitted to this facility for short-term rehabilitation after the patient's recent hospitalization.  Patient has completed SNF rehabilitation and therapy has cleared the patient for discharge.  Reassessment of ongoing problem(s):  GERD: pt's GERD is stable.  Denies ongoing heartburn, abd. Pain, nausea or vomiting.  Currently on a PPI & tolerates it without any adverse reactions.  ANXIETY: The anxiety remains stable. [name] No complications reported from the medications currently being used.  DEPRESSION: The depression remains stable. [name], insomnia, anedhonia or lack of appetite. No complications reported from the medications currently being used. Staff do not report behavioral problems. PAST MEDICAL HISTORY : Reviewed.  No changes.  CURRENT MEDICATIONS: Reviewed per [facility]  REVIEW OF SYSTEMS:  GENERAL: no change in appetite, no fatigue, no weight changes, no fever, chills or weakness RESPIRATORY: no cough, SOB, DOE, wheezing, hemoptysis CARDIAC: no chest pain, edema or palpitations GI: no abdominal pain, diarrhea, constipation, heart burn, nausea or vomiting  PHYSICAL EXAMINATION  VS:  T97.8     P92       RR20      BP128/91            WT211.2 (Lb)  GENERAL: no acute distress, normal body habitus EYES: conjunctivae normal, sclerae normal, normal eye lids NECK: supple, trachea midline, no neck  masses, no thyroid tenderness, no thyromegaly LYMPHATICS: no LAN in the neck, no supraclavicular LAN RESPIRATORY: breathing is even & unlabored, BS CTAB CARDIAC: RRR, no murmur,no extra heart sounds, no edema GI: abdomen soft, normal BS, no masses, no tenderness, no hepatomegaly, no splenomegaly PSYCHIATRIC: the patient is alert & oriented to person, affect & behavior appropriate  LABS/RADIOLOGY: 03/03/13 sodium 139 potassium 4.2 glucose 96 BUN 11 creatinine 0.8 calcium 9.1 WBC 4.0 hemoglobin 10.8 hematocrit 33.9 02/28/13 WBC 4.9 hemoglobin 10.2 hematocrit 30.4  ASSESSMENT/PLAN:  Avascular Necrosis of bone on left hip S/P Left total hip arthroplasty - for Home health PT, OT and Nursing  Left hip pain - continue Oxycontin, CR15mg  PO Q 12H, Nerontin 300 mg PO Q HS and Percocet 5/325 mg PO Q 6 hours PRN  Depression - continue Seroquel  Imsomnia - continue Ambien  GERD - continue Nexium PRN  Anxiety - continue Klonopin   I have filled out patient's discharge paperwork and written prescriptions.  Patient will receive home health PT, OT and Nursing.  Total discharge time: Less than 30 minutes Discharge time involved coordination of the discharge process with Child psychotherapist, nursing staff and therapy department. Medical justification for home health services verified.  CPT CODE: 40981

## 2013-04-21 ENCOUNTER — Encounter: Payer: Self-pay | Admitting: Internal Medicine

## 2013-04-21 DIAGNOSIS — F17200 Nicotine dependence, unspecified, uncomplicated: Secondary | ICD-10-CM | POA: Insufficient documentation

## 2013-04-21 NOTE — Progress Notes (Signed)
Patient ID: [name], male   DOB: 04-09-1965, 48 y.o.   MRN: [id number]               HISTORY & PHYSICAL  DATE: 03/02/2013       FACILITY: [name]  LEVEL OF CARE: SNF (31)  ALLERGIES:  Allergies  Allergen Reactions  . Antihistamines, Chlorpheniramine-Type Other (See Comments)    All antihistamines-Behavior issues  . Levaquin [Levofloxacin]     disorientation  . Prednisone Other (See Comments)    anxiety    CHIEF COMPLAINT:  Manage left hip avascular necrosis, acute blood loss anemia, and GERD.    HISTORY OF PRESENT ILLNESS:  The patient is a 48 year-old, Caucasian male.    LEFT HIP AVASCULAR NECROSIS:  Patient was having severe, unremitting pain that was affecting sleep, daily activities, and hobbies.  Therefore, patient underwent left total hip arthroplasty and tolerated the procedure well.  He is admitted to this facility for short-term rehabilitation.  He is complaining of uncontrolled hip pain.    ANEMIA:  Postoperatively, patient suffered acute blood loss.   The anemia has been stable. The patient denies fatigue, melena or hematochezia.  The patient is currently not on iron.   Last hemoglobins are:    10.2, 12.1.    GERD: pt's GERD is stable.  Denies ongoing heartburn, abd. Pain, nausea or vomiting.  Currently on a PPI & tolerates it without any adverse reactions.    PAST MEDICAL HISTORY :  Past Medical History  Diagnosis Date  . Anxiety   . Depression   . Insomnia   . Neck pain     pt states he has 3 bulging disks -no surgery  . PTSD (post-traumatic stress disorder)   . Chronic abdominal pain   . Tachycardia     pt states his heart rate may be elevated because of anxiety over planned surgery  . GERD (gastroesophageal reflux disease)   . Arthritis     avascular necrosis bilateral hips - right hip pain usally hurts worse.   neck pain-has bulging disks in his neck -  . Carotid artery stenosis     "MILD, LESS THAN OR EQUAL TO 39%, LEFT AND  RIGHT INTERNAL CAROTID ARTERY STENOSIS" - PER ULTRASOUND REPORT 09/28/12 [facility].  Marland Kitchen Smoking   . Sleep apnea     has cpap - but does not use-does not know setting-doesnot tolerate mask    PAST SURGICAL HISTORY: Past Surgical History  Procedure Laterality Date  . Cholecystectomy    . Colonoscopy    . Total hip arthroplasty Right 11/19/2012    Procedure: RIGHT TOTAL HIP ARTHROPLASTY ANTERIOR APPROACH;  Surgeon: Kathryne Hitch, MD;  Location: [facility];  Service: Orthopedics;  Laterality: Right;  . Total hip arthroplasty Left 02/25/2013    Procedure: LEFT TOTAL HIP ARTHROPLASTY ANTERIOR APPROACH;  Surgeon: Kathryne Hitch, MD;  Location: [facility];  Service: Orthopedics;  Laterality: Left;    SOCIAL HISTORY:  reports that he has been smoking Cigarettes.  He has a 31 pack-year smoking history. He does not have any smokeless tobacco history on file. He reports that he drinks alcohol. He reports that he does not use illicit drugs.  FAMILY HISTORY:  Family History  Problem Relation Age of Onset  . Diabetes Mother     CURRENT MEDICATIONS: Reviewed per [facility]  REVIEW OF SYSTEMS:  See HPI otherwise 14 point ROS is negative.  PHYSICAL EXAMINATION  VS:  T 98  P 104      RR 18      BP 108/62      POX 94%        WT (Lb)  GENERAL: no acute distress, normal body habitus EYES: conjunctivae normal, sclerae normal, normal eye lids MOUTH/THROAT: lips without lesions,no lesions in the mouth,tongue is without lesions,uvula elevates in midline NECK: supple, trachea midline, no neck masses, no thyroid tenderness, no thyromegaly LYMPHATICS: no LAN in the neck, no supraclavicular LAN RESPIRATORY: breathing is even & unlabored, BS CTAB CARDIAC: RRR, no murmur,no extra heart sounds, no edema GI:  ABDOMEN: abdomen soft, normal BS, no masses, no tenderness  LIVER/SPLEEN: no hepatomegaly, no splenomegaly MUSCULOSKELETAL: HEAD: normal to inspection & palpation BACK: no kyphosis,  scoliosis or spinal processes tenderness EXTREMITIES: LEFT UPPER EXTREMITY: full range of motion, normal strength & tone RIGHT UPPER EXTREMITY:  full range of motion, normal strength & tone LEFT LOWER EXTREMITY: strength intact, range of motion not tested due to surgery   RIGHT LOWER EXTREMITY: strength intact, range of motion moderate   PSYCHIATRIC: the patient is alert & oriented to person, affect & behavior appropriate  LABS/RADIOLOGY: Left hip x-ray:  Showed underlying avascular necrosis.  No fracture or dislocation.    Left hip x-ray after surgery:  Showed interval left hip placement.    Chest CT:  Did not show acute PE.    Pelvic x-ray:  Showed left hip replacement.  No acute abnormalities.    Labs reviewed: Basic Metabolic Panel:  Recent Labs  16/12/9606/14/14 1505 11/20/12 0500 02/13/13 0546 02/26/13 0519  NA 136 138 139 138  K 3.9 3.3* 3.9 3.7  CL 105 103 101 100  CO2 25 30  --  30  GLUCOSE 96 104* 111* 118*  BUN 8 <3* 13 5*  CREATININE 0.89 0.84 1.10 0.83  CALCIUM 8.9 8.7  --  8.8   Liver Function Tests:  Recent Labs  05/01/12 2154  AST 28  ALT 21  ALKPHOS 62  BILITOT 0.3  PROT 7.0  ALBUMIN 3.5    Recent Labs  05/01/12 2154  LIPASE 49   CBC:  Recent Labs  05/01/12 2154  02/26/13 0519 02/27/13 0534 02/28/13 0520  WBC 4.7  < > 5.0 7.0 4.9  NEUTROABS 1.7  --   --   --   --   HGB 15.5  < > 11.7* 12.1* 10.2*  HCT 43.4  < > 35.6* 36.2* 30.4*  MCV 96.7  < > 94.7 94.0 93.5  PLT 213  < > 250 224 219  < > = values in this interval not displayed.   ASSESSMENT/PLAN:  Left hip avascular necrosis.  Status post left total hip replacement.      Acute blood loss anemia.  Reassess hemoglobin level.    GERD.  Well controlled.     Tobacco abuse.  Continue nicotine patch.    Anxiety.  Continue clonazepam.    Left hip pain.  Uncontrolled problem.  We will increase OxyContin to 30 mg b.i.d.    Check CBC and BMP.    [facility] Metrics:   Aspirin 325 b.i.d.   Positive tobacco use.    I have reviewed patient's medical records received at admission/from hospitalization.  CPT CODE: [id number]

## 2013-08-18 IMAGING — CR DG CHEST 2V
2 series · 2 of 2 positions shown · non-contrast
Comparison: June 03, 2010.

CLINICAL DATA: Tachycardia

CHEST - 2 VIEW

[w chest pa]
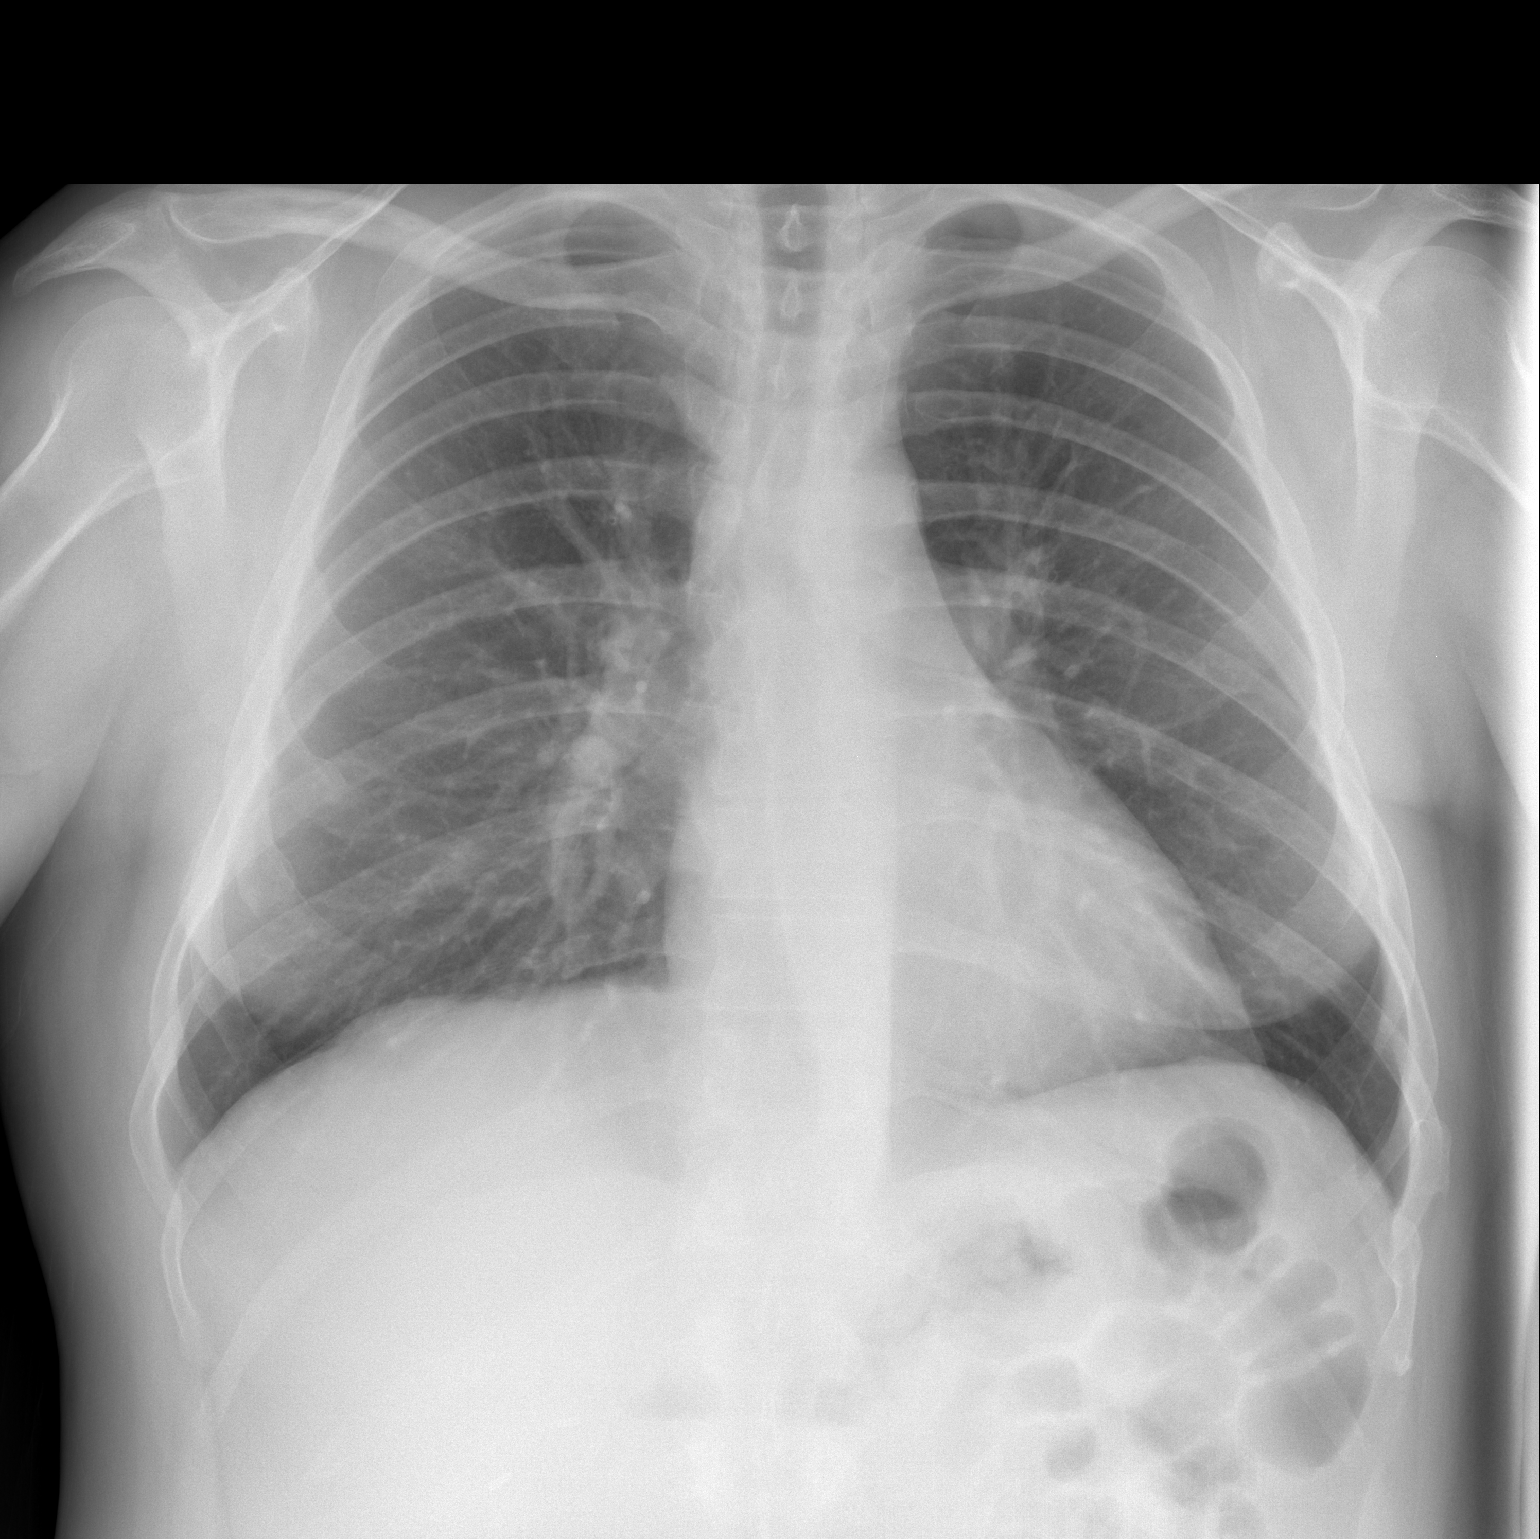

[w chest lat]
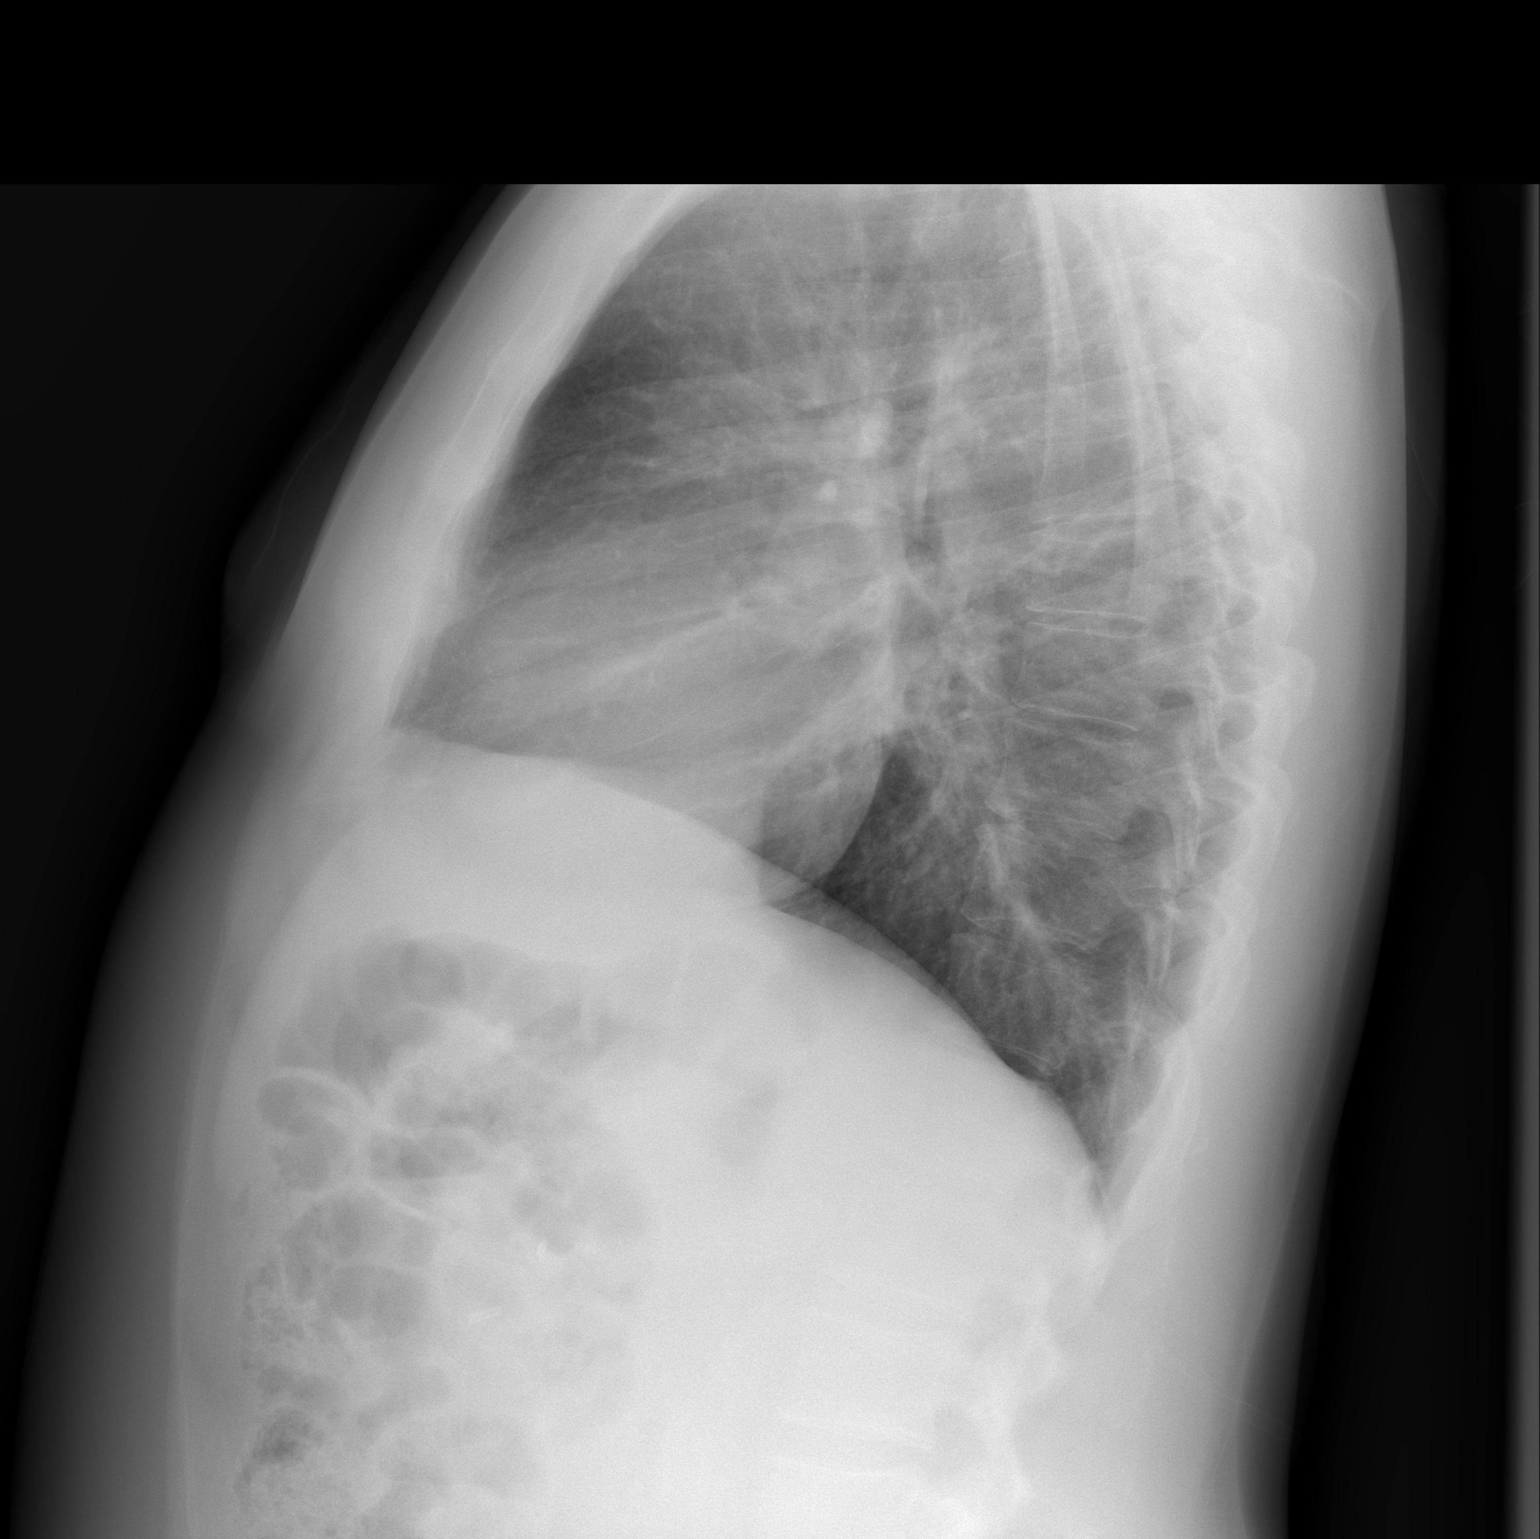

[2 of 2 positions shown; findings below may reference images not displayed]

FINDINGS: Cardiomediastinal silhouette appears normal.  No acute
pulmonary disease is noted.  Old right rib fractures are noted.  No
pleural effusion or pneumothorax is noted.
IMPRESSION: No acute cardiopulmonary abnormality seen.

## 2015-01-13 ENCOUNTER — Emergency Department (HOSPITAL_COMMUNITY): Payer: Medicare Other

## 2015-01-13 ENCOUNTER — Encounter (HOSPITAL_COMMUNITY): Payer: Self-pay | Admitting: Emergency Medicine

## 2015-01-13 ENCOUNTER — Emergency Department (HOSPITAL_COMMUNITY)
Admission: EM | Admit: 2015-01-13 | Discharge: 2015-01-13 | Disposition: A | Discharge status: Home / Self Care | Payer: Medicare Other | Attending: Emergency Medicine | Admitting: Emergency Medicine

## 2015-01-13 DIAGNOSIS — J40 Bronchitis, not specified as acute or chronic: Secondary | ICD-10-CM | POA: Insufficient documentation

## 2015-01-13 DIAGNOSIS — F419 Anxiety disorder, unspecified: Secondary | ICD-10-CM | POA: Insufficient documentation

## 2015-01-13 DIAGNOSIS — G8929 Other chronic pain: Secondary | ICD-10-CM | POA: Diagnosis not present

## 2015-01-13 DIAGNOSIS — G4730 Sleep apnea, unspecified: Secondary | ICD-10-CM | POA: Diagnosis not present

## 2015-01-13 DIAGNOSIS — G4700 Insomnia, unspecified: Secondary | ICD-10-CM | POA: Diagnosis not present

## 2015-01-13 DIAGNOSIS — K219 Gastro-esophageal reflux disease without esophagitis: Secondary | ICD-10-CM | POA: Insufficient documentation

## 2015-01-13 DIAGNOSIS — M1990 Unspecified osteoarthritis, unspecified site: Secondary | ICD-10-CM | POA: Diagnosis not present

## 2015-01-13 DIAGNOSIS — Z720 Tobacco use: Secondary | ICD-10-CM | POA: Insufficient documentation

## 2015-01-13 DIAGNOSIS — F329 Major depressive disorder, single episode, unspecified: Secondary | ICD-10-CM | POA: Diagnosis not present

## 2015-01-13 DIAGNOSIS — Z9981 Dependence on supplemental oxygen: Secondary | ICD-10-CM | POA: Diagnosis not present

## 2015-01-13 DIAGNOSIS — Z79899 Other long term (current) drug therapy: Secondary | ICD-10-CM | POA: Diagnosis not present

## 2015-01-13 DIAGNOSIS — R062 Wheezing: Secondary | ICD-10-CM | POA: Diagnosis present

## 2015-01-13 MED ORDER — IPRATROPIUM-ALBUTEROL 0.5-2.5 (3) MG/3ML IN SOLN
3.0000 mL | Freq: Once | RESPIRATORY_TRACT | Status: AC
  Administered 2015-01-13: 3 mL via RESPIRATORY_TRACT
  Filled 2015-01-13: qty 3

## 2015-01-13 MED ORDER — ALBUTEROL SULFATE HFA 108 (90 BASE) MCG/ACT IN AERS: 1.0000 | INHALATION_SPRAY | Freq: Four times a day (QID) | RESPIRATORY_TRACT | Status: DC | PRN

## 2015-01-13 NOTE — ED Provider Notes (Signed)
CSN: [id number]     Arrival date & time 01/13/15  0957 History   First MD Initiated Contact with Patient 01/13/15 1001     Chief Complaint  Patient presents with  . Nasal Congestion  . Wheezing   HPI   49 year old male presents today with upper respiratory congestion and cough. Patient reports symptoms have been present for several weeks. Patient reports that symptoms are worse in the morning after awakening with chest congestion. Patient reports that he's been using his wife's nebulized treatment at home that seems to improve symptoms. Patient reports today after taking a warm shower he felt much better. Patient reports some sinus congestion, rhinorrhea, denies fever, shortness of breath, chest pain, abdominal pain, nausea vomiting diarrhea. Patient denies a history of asthma, but has been prescribed albuterol as needed for bronchitis. He is a smoker with a long-standing history.   Past Medical History  Diagnosis Date  . Anxiety   . Depression   . Insomnia   . Neck pain     pt states he has 3 bulging disks -no surgery  . PTSD (post-traumatic stress disorder)   . Chronic abdominal pain   . Tachycardia     pt states his heart rate may be elevated because of anxiety over planned surgery  . GERD (gastroesophageal reflux disease)   . Arthritis     avascular necrosis bilateral hips - right hip pain usally hurts worse.   neck pain-has bulging disks in his neck -  . Carotid artery stenosis     "MILD, LESS THAN OR EQUAL TO 39%, LEFT AND RIGHT INTERNAL CAROTID ARTERY STENOSIS" - PER ULTRASOUND REPORT 09/28/12 [facility].  Marland Kitchen Smoking   . Sleep apnea     has cpap - but does not use-does not know setting-doesnot tolerate mask   Past Surgical History  Procedure Laterality Date  . Cholecystectomy    . Colonoscopy    . Total hip arthroplasty Right 11/19/2012    Procedure: RIGHT TOTAL HIP ARTHROPLASTY ANTERIOR APPROACH;  Surgeon: Kathryne Hitch, MD;  Location: [facility];  Service:  Orthopedics;  Laterality: Right;  . Total hip arthroplasty Left 02/25/2013    Procedure: LEFT TOTAL HIP ARTHROPLASTY ANTERIOR APPROACH;  Surgeon: Kathryne Hitch, MD;  Location: [facility];  Service: Orthopedics;  Laterality: Left;   Family History  Problem Relation Age of Onset  . Diabetes Mother    Social History  Substance Use Topics  . Smoking status: Current Every Day Smoker -- 1.00 packs/day for 31 years    Types: Cigarettes  . Smokeless tobacco: None  . Alcohol Use: Yes     Comment: once a week - a beer---states he no longer uses marijuana and denies use of any other recreational drugs/ office notes obtained from [facility] - last drug screen 10/22/12 positive for marijuana    Review of Systems  All other systems reviewed and are negative.   Allergies  Antihistamines, chlorpheniramine-type; Levaquin; and Prednisone  Home Medications   Prior to Admission medications   Medication Sig Start Date End Date Taking? Authorizing Provider  ALPRAZolam Prudy Feeler) 1 MG tablet Take 1 tablet (1 mg total) by mouth 3 (three) times daily as needed for anxiety. 03/02/13  Yes Sharon Seller, NP  clonazePAM (KLONOPIN) 1 MG tablet Take 1 mg by mouth at bedtime.   Yes Historical Provider, MD  dicyclomine (BENTYL) 20 MG tablet Take 20 mg by mouth every 6 (six) hours as needed for spasms.  Yes Historical Provider, MD  ELIQUIS 5 MG TABS tablet Take 5 mg by mouth 2 (two) times daily. 01/04/15  Yes Historical Provider, MD  esomeprazole (NEXIUM) 20 MG capsule Take 40 mg by mouth daily at 12 noon.   Yes Historical Provider, MD  methocarbamol (ROBAXIN) 750 MG tablet Take 750 mg by mouth 4 (four) times daily as needed for muscle spasms.    Yes Historical Provider, MD  oxyCODONE (ROXICODONE) 15 MG immediate release tablet Take 1 tablet (15 mg total) by mouth every 6 (six) hours as needed for pain. Patient taking differently: Take 15 mg by mouth every 6 (six) hours.  02/28/13  Yes Kirtland Bouchard, PA-C  risperidone (RISPERDAL) 4 MG tablet Take 8 mg by mouth at bedtime.   Yes Historical Provider, MD  zolpidem (AMBIEN) 10 MG tablet Take 10 mg by mouth at bedtime.   Yes Historical Provider, MD  albuterol (PROVENTIL HFA;VENTOLIN HFA) 108 (90 BASE) MCG/ACT inhaler Inhale 1-2 puffs into the lungs every 6 (six) hours as needed for wheezing or shortness of breath. 01/13/15   Eyvonne Mechanic, PA-C  aspirin EC 325 MG EC tablet Take 1 tablet (325 mg total) by mouth 2 (two) times daily after a meal. Patient not taking: Reported on 01/13/2015 02/28/13   Kirtland Bouchard, PA-C  gabapentin (NEURONTIN) 300 MG capsule Take 1 capsule (300 mg total) by mouth at bedtime. Patient not taking: Reported on 01/13/2015 03/01/13   Kathryne Hitch, MD  methocarbamol (ROBAXIN) 500 MG tablet Take 1 tablet (500 mg total) by mouth every 6 (six) hours as needed for muscle spasms. Patient not taking: Reported on 01/13/2015 02/28/13   Kirtland Bouchard, PA-C  OxyCODONE (OXYCONTIN) 15 mg T12A 12 hr tablet Take one tablet by mouth at 8am,12pm,4pm,and 8pm as needed for pain Patient not taking: Reported on 01/13/2015 03/07/13   Tiffany L Reed, DO  OxyCODONE HCl ER (OXYCONTIN) 30 MG T12A Take one tablet by mouth every 12 hours. Do not crush Patient not taking: Reported on 01/13/2015 03/02/13   Sharon Seller, NP  zolpidem (AMBIEN CR) 12.5 MG CR tablet Take 1 tablet (12.5 mg total) by mouth at bedtime. sleep Patient not taking: Reported on 01/13/2015 11/22/12   Sharon Seller, NP   BP 98/66 mmHg  Pulse 79  Temp(Src) 98.7 F (37.1 C) (Oral)  Resp 16  SpO2 96%   Physical Exam  Constitutional: He is oriented to person, place, and time. He appears well-developed and well-nourished.  HENT:  Head: Normocephalic and atraumatic.  Right Ear: Tympanic membrane and external ear normal.  Left Ear: Tympanic membrane and external ear normal.  Nose: Rhinorrhea present. No mucosal edema.  Mouth/Throat: Uvula is midline,  oropharynx is clear and moist and mucous membranes are normal.  Eyes: Conjunctivae are normal. Pupils are equal, round, and reactive to light. Right eye exhibits no discharge. Left eye exhibits no discharge. No scleral icterus.  Neck: Normal range of motion. No JVD present. No tracheal deviation present.  Cardiovascular: Regular rhythm, normal heart sounds and intact distal pulses.  Exam reveals no gallop.   No murmur heard. Pulmonary/Chest: Effort normal. No stridor. He has wheezes.  Musculoskeletal: Normal range of motion. He exhibits no edema or tenderness.  Neurological: He is alert and oriented to person, place, and time. Coordination normal.  Skin: Skin is warm and dry. No rash noted. No erythema. No pallor.  Psychiatric: He has a normal mood and affect. His behavior is normal. Judgment and thought  content normal.  Nursing note and vitals reviewed.   ED Course  Procedures (including critical care time) Labs Review Labs Reviewed - No data to display  Imaging Review No results found. I have personally reviewed and evaluated these images and lab results as part of my medical decision-making.   EKG Interpretation None      MDM   Final diagnoses:  Bronchitis    Labs:  Imaging: DG chest shows bronchitic change without cardiopulmonary disease  Consults:  Therapeutics:  Discharge Meds: Albuterol  Assessment/Plan: Patient's presentation most consistent with bronchitis. Vital signs reassuring, lung sounds are reassuring. Patient in no acute distress the time my evaluation. He will be discharged home with instructions to follow-up with his primary care provider for reevaluation. Strict return precautions given.         Eyvonne MechanicJeffrey Bradlee Bridgers, PA-C 01/15/15 1437  Geoffery Lyonsouglas Delo, MD 01/17/15 [phone]

## 2015-01-13 NOTE — ED Notes (Signed)
Pt reports nasal drainage, post nasal drip worse in the morning when waking up, with chest congestion. Pt reports clear sputum but not able to cough up much. Denies fevers. Reports the nasal drainage woke up him last night because he could  Not breathe. Pt has been taking albuterol but nothing else.

## 2015-01-13 NOTE — Discharge Instructions (Signed)
How to Use an Inhaler Proper inhaler technique is very important. Good technique ensures that the medicine reaches the lungs. Poor technique results in depositing the medicine on the tongue and back of the throat rather than in the airways. If you do not use the inhaler with good technique, the medicine will not help you. STEPS TO FOLLOW IF USING AN INHALER WITHOUT AN EXTENSION TUBE  Remove the cap from the inhaler.  If you are using the inhaler for the first time, you will need to prime it. Shake the inhaler for 5 seconds and release four puffs into the air, away from your face. Ask your health care provider or pharmacist if you have questions about priming your inhaler.  Shake the inhaler for 5 seconds before each breath in (inhalation).  Position the inhaler so that the top of the canister faces up.  Put your index finger on the top of the medicine canister. Your thumb supports the bottom of the inhaler.  Open your mouth.  Either place the inhaler between your teeth and place your lips tightly around the mouthpiece, or hold the inhaler 1-2 inches away from your open mouth. If you are unsure of which technique to use, ask your health care provider.  Breathe out (exhale) normally and as completely as possible.  Press the canister down with your index finger to release the medicine.  At the same time as the canister is pressed, inhale deeply and slowly until your lungs are completely filled. This should take 4-6 seconds. Keep your tongue down.  Hold the medicine in your lungs for 5-10 seconds (10 seconds is best). This helps the medicine get into the small airways of your lungs.  Breathe out slowly, through pursed lips. Whistling is an example of pursed lips.  Wait at least 15-30 seconds between puffs. Continue with the above steps until you have taken the number of puffs your health care provider has ordered. Do not use the inhaler more than your health care provider tells  you.  Replace the cap on the inhaler.  Follow the directions from your health care provider or the inhaler insert for cleaning the inhaler. STEPS TO FOLLOW IF USING AN INHALER WITH AN EXTENSION (SPACER)  Remove the cap from the inhaler.  If you are using the inhaler for the first time, you will need to prime it. Shake the inhaler for 5 seconds and release four puffs into the air, away from your face. Ask your health care provider or pharmacist if you have questions about priming your inhaler.  Shake the inhaler for 5 seconds before each breath in (inhalation).  Place the open end of the spacer onto the mouthpiece of the inhaler.  Position the inhaler so that the top of the canister faces up and the spacer mouthpiece faces you.  Put your index finger on the top of the medicine canister. Your thumb supports the bottom of the inhaler and the spacer.  Breathe out (exhale) normally and as completely as possible.  Immediately after exhaling, place the spacer between your teeth and into your mouth. Close your lips tightly around the spacer.  Press the canister down with your index finger to release the medicine.  At the same time as the canister is pressed, inhale deeply and slowly until your lungs are completely filled. This should take 4-6 seconds. Keep your tongue down and out of the way.  Hold the medicine in your lungs for 5-10 seconds (10 seconds is best). This helps the  medicine get into the small airways of your lungs. Exhale.  Repeat inhaling deeply through the spacer mouthpiece. Again hold that breath for up to 10 seconds (10 seconds is best). Exhale slowly. If it is difficult to take this second deep breath through the spacer, breathe normally several times through the spacer. Remove the spacer from your mouth.  Wait at least 15-30 seconds between puffs. Continue with the above steps until you have taken the number of puffs your health care provider has ordered. Do not use the  inhaler more than your health care provider tells you.  Remove the spacer from the inhaler, and place the cap on the inhaler.  Follow the directions from your health care provider or the inhaler insert for cleaning the inhaler and spacer. If you are using different kinds of inhalers, use your quick relief medicine to open the airways 10-15 minutes before using a steroid if instructed to do so by your health care provider. If you are unsure which inhalers to use and the order of using them, ask your health care provider, nurse, or respiratory therapist. If you are using a steroid inhaler, always rinse your mouth with water after your last puff, then gargle and spit out the water. Do not swallow the water. AVOID:  Inhaling before or after starting the spray of medicine. It takes practice to coordinate your breathing with triggering the spray.  Inhaling through the nose (rather than the mouth) when triggering the spray. HOW TO DETERMINE IF YOUR INHALER IS FULL OR NEARLY EMPTY You cannot know when an inhaler is empty by shaking it. A few inhalers are now being made with dose counters. Ask your health care provider for a prescription that has a dose counter if you feel you need that extra help. If your inhaler does not have a counter, ask your health care provider to help you determine the date you need to refill your inhaler. Write the refill date on a calendar or your inhaler canister. Refill your inhaler 7-10 days before it runs out. Be sure to keep an adequate supply of medicine. This includes making sure it is not expired, and that you have a spare inhaler.  SEEK MEDICAL CARE IF:   Your symptoms are only partially relieved with your inhaler.  You are having trouble using your inhaler.  You have some increase in phlegm. SEEK IMMEDIATE MEDICAL CARE IF:   You feel little or no relief with your inhalers. You are still wheezing and are feeling shortness of breath or tightness in your chest or  both.  You have dizziness, headaches, or a fast heart rate.  You have chills, fever, or night sweats.  You have a noticeable increase in phlegm production, or there is blood in the phlegm. MAKE SURE YOU:   Understand these instructions.  Will watch your condition.  Will get help right away if you are not doing well or get worse.   This information is not intended to replace advice given to you by your health care provider. Make sure you discuss any questions you have with your health care provider.   Document Released: 03/14/2000 Document Revised: 01/05/2013 Document Reviewed: 10/14/2012 Elsevier Interactive Patient Education 2016 [employer]  Upper Respiratory Infection, Adult Most upper respiratory infections (URIs) are a viral infection of the air passages leading to the lungs. A URI affects the nose, throat, and upper air passages. The most common type of URI is nasopharyngitis and is typically referred to as "the common cold."  URIs run their course and usually go away on their own. Most of the time, a URI does not require medical attention, but sometimes a bacterial infection in the upper airways can follow a viral infection. This is called a secondary infection. Sinus and middle ear infections are common types of secondary upper respiratory infections. Bacterial pneumonia can also complicate a URI. A URI can worsen asthma and chronic obstructive pulmonary disease (COPD). Sometimes, these complications can require emergency medical care and may be life threatening.  CAUSES Almost all URIs are caused by viruses. A virus is a type of germ and can spread from one person to another.  RISKS FACTORS You may be at risk for a URI if:   You smoke.   You have chronic heart or lung disease.  You have a weakened defense (immune) system.   You are very young or very old.   You have nasal allergies or asthma.  You work in crowded or poorly ventilated areas.  You work in health  care facilities or schools. SIGNS AND SYMPTOMS  Symptoms typically develop 2-3 days after you come in contact with a cold virus. Most viral URIs last 7-10 days. However, viral URIs from the influenza virus (flu virus) can last 14-18 days and are typically more severe. Symptoms may include:   Runny or stuffy (congested) nose.   Sneezing.   Cough.   Sore throat.   Headache.   Fatigue.   Fever.   Loss of appetite.   Pain in your forehead, behind your eyes, and over your cheekbones (sinus pain).  Muscle aches.  DIAGNOSIS  Your health care provider may diagnose a URI by:  Physical exam.  Tests to check that your symptoms are not due to another condition such as:  Strep throat.  Sinusitis.  Pneumonia.  Asthma. TREATMENT  A URI goes away on its own with time. It cannot be cured with medicines, but medicines may be prescribed or recommended to relieve symptoms. Medicines may help:  Reduce your fever.  Reduce your cough.  Relieve nasal congestion. HOME CARE INSTRUCTIONS   Take medicines only as directed by your health care provider.   Gargle warm saltwater or take cough drops to comfort your throat as directed by your health care provider.  Use a warm mist humidifier or inhale steam from a shower to increase air moisture. This may make it easier to breathe.  Drink enough fluid to keep your urine clear or pale yellow.   Eat soups and other clear broths and maintain good nutrition.   Rest as needed.   Return to work when your temperature has returned to normal or as your health care provider advises. You may need to stay home longer to avoid infecting others. You can also use a face mask and careful hand washing to prevent spread of the virus.  Increase the usage of your inhaler if you have asthma.   Do not use any tobacco products, including cigarettes, chewing tobacco, or electronic cigarettes. If you need help quitting, ask your health care  provider. PREVENTION  The best way to protect yourself from getting a cold is to practice good hygiene.   Avoid oral or hand contact with people with cold symptoms.   Wash your hands often if contact occurs.  There is no clear evidence that vitamin C, vitamin E, echinacea, or exercise reduces the chance of developing a cold. However, it is always recommended to get plenty of rest, exercise, and practice good nutrition.  SEEK MEDICAL CARE IF:   You are getting worse rather than better.   Your symptoms are not controlled by medicine.   You have chills.  You have worsening shortness of breath.  You have [place] or red mucus.  You have yellow or Lumb nasal discharge.  You have pain in your face, especially when you bend forward.  You have a fever.  You have swollen neck glands.  You have pain while swallowing.  You have white areas in the back of your throat. SEEK IMMEDIATE MEDICAL CARE IF:   You have severe or persistent:  Headache.  Ear pain.  Sinus pain.  Chest pain.  You have chronic lung disease and any of the following:  Wheezing.  Prolonged cough.  Coughing up blood.  A change in your usual mucus.  You have a stiff neck.  You have changes in your:  Vision.  Hearing.  Thinking.  Mood. MAKE SURE YOU:   Understand these instructions.  Will watch your condition.  Will get help right away if you are not doing well or get worse.   This information is not intended to replace advice given to you by your health care provider. Make sure you discuss any questions you have with your health care provider.   Document Released: 09/10/2000 Document Revised: 08/01/2014 Document Reviewed: 06/22/2013 Elsevier Interactive Patient Education 2016 [employer]  Please contact her primary care provider inform them of visit today and all relevant data. Please follow-up in 2-3 days for reevaluation, sooner as needed. If Worsening signs or symptoms present  please return immediately to the emergency room for further evaluation and management.

## 2016-07-15 ENCOUNTER — Telehealth (INDEPENDENT_AMBULATORY_CARE_PROVIDER_SITE_OTHER): Payer: Self-pay | Admitting: Orthopaedic Surgery

## 2016-07-15 NOTE — Telephone Encounter (Signed)
Pt wife wants to know if there needs to be an antibiotic called in before pt dentist appt  [phone]

## 2016-07-16 NOTE — Telephone Encounter (Signed)
Patient aware he does not need antibiotics

## 2020-09-13 ENCOUNTER — Encounter (HOSPITAL_BASED_OUTPATIENT_CLINIC_OR_DEPARTMENT_OTHER): Payer: Self-pay

## 2020-09-13 ENCOUNTER — Emergency Department (HOSPITAL_BASED_OUTPATIENT_CLINIC_OR_DEPARTMENT_OTHER)
Admission: EM | Admit: 2020-09-13 | Discharge: 2020-09-13 | Disposition: A | Discharge status: Home / Self Care | Payer: Medicare Other | Attending: Emergency Medicine | Admitting: Emergency Medicine

## 2020-09-13 ENCOUNTER — Other Ambulatory Visit: Payer: Self-pay

## 2020-09-13 DIAGNOSIS — Z760 Encounter for issue of repeat prescription: Secondary | ICD-10-CM | POA: Insufficient documentation

## 2020-09-13 DIAGNOSIS — Z96643 Presence of artificial hip joint, bilateral: Secondary | ICD-10-CM | POA: Diagnosis not present

## 2020-09-13 DIAGNOSIS — Z87891 Personal history of nicotine dependence: Secondary | ICD-10-CM | POA: Insufficient documentation

## 2020-09-13 NOTE — ED Provider Notes (Signed)
Emergency Department Provider Note   I have reviewed the triage vital signs and the nursing notes.   HISTORY  Chief Complaint Medication Refill   HPI [name] is a 55 y.o. male with past medical history reviewed below presents to the emergency department for refill of his oxycodone 10 mg tabs.  He has chronic pain in his hip and neck and is under a pain contract with his primary care doctor, according to the patient.  He states he had a refill recently but lost the medication at home and has been unable to find it.  He tells me that he called the office but his regular prescriber is on vacation.  He presents here for evaluation and refill of his oxycodone.  Past Medical History:  Diagnosis Date   Anxiety    Arthritis    avascular necrosis bilateral hips - right hip pain usally hurts worse.   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   Chronic abdominal pain    Depression    GERD (gastroesophageal reflux disease)    Insomnia    Neck pain    pt states he has 3 bulging disks -no surgery   PTSD (post-traumatic stress disorder)    Sleep apnea    has cpap - but does not use-does not know setting-doesnot tolerate mask   Smoking    Tachycardia    pt states his heart rate may be elevated because of anxiety over planned surgery    Patient Active Problem List   Diagnosis Date Noted   Tobacco use disorder 04/21/2013   Left hip pain 03/08/2013   Avascular necrosis of bone of left hip (HCC) 02/25/2013   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   Acute posthemorrhagic anemia 12/27/2012   Hip pain, acute 12/27/2012   GERD (gastroesophageal reflux disease) 11/24/2012   Insomnia 11/24/2012   Carotid artery stenosis 11/24/2012   Avascular necrosis of bone of right hip (HCC) 11/19/2012   Abdominal pain  12/16/2011   Chronic pancreatitis (HCC) 12/16/2011   Anxiety 12/16/2011   Depression 12/16/2011    Past Surgical History:  Procedure Laterality Date   CHOLECYSTECTOMY     COLONOSCOPY     TOTAL HIP ARTHROPLASTY Right 11/19/2012   Procedure: RIGHT TOTAL HIP ARTHROPLASTY ANTERIOR APPROACH;  Surgeon: Kathryne Hitch, MD;  Location: [facility];  Service: Orthopedics;  Laterality: Right;   TOTAL HIP ARTHROPLASTY Left 02/25/2013   Procedure: LEFT TOTAL HIP ARTHROPLASTY ANTERIOR APPROACH;  Surgeon: Kathryne Hitch, MD;  Location: [facility];  Service: Orthopedics;  Laterality: Left;    Allergies Antihistamines, chlorpheniramine-type; Levaquin [levofloxacin]; and Prednisone  Family History  Problem Relation Age of Onset   Diabetes Mother     Social History Social History   Tobacco Use   Smoking status: Former    Packs/day: 1.00    Years: 31.00    Pack years: 31.00    Types: Cigarettes   Smokeless tobacco: Former  Building services engineer Use: Never used  Substance Use Topics   Alcohol use: Not Currently    Comment: once a week - a beer---states he no longer uses marijuana and denies use of any other recreational drugs/ office notes obtained from [facility] - last drug screen 10/22/12 positive for marijuana   Drug use: No  Comment: previous history of THC use    Review of Systems  Constitutional: No fever/chills Eyes: No visual changes. ENT: No sore throat. Cardiovascular: Denies chest pain. Respiratory: Denies shortness of breath. Gastrointestinal: No abdominal pain.  No nausea, no vomiting.  No diarrhea.  No constipation. Genitourinary: Negative for dysuria. Musculoskeletal: Negative for back pain. Chronic neck and hip pain.  Skin: Negative for rash. Neurological: Negative for headaches, focal weakness or numbness.  10-point ROS otherwise negative.  ____________________________________________   PHYSICAL EXAM:  VITAL SIGNS: ED Triage Vitals  Enc  Vitals Group     BP 09/13/20 1718 132/87     Pulse Rate 09/13/20 1718 (!) 114     Resp 09/13/20 1718 18     Temp 09/13/20 1718 99.2 F (37.3 C)     Temp Source 09/13/20 1718 Oral     SpO2 09/13/20 1718 100 %     Weight 09/13/20 1716 220 lb (99.8 kg)     Height 09/13/20 1716 5\' 10"  (1.778 m)   Constitutional: Alert and oriented. Well appearing and in no acute distress. Eyes: Conjunctivae are normal. Head: Atraumatic. Nose: No congestion/rhinnorhea. Mouth/Throat: Mucous membranes are moist.   Neck: No stridor.   Cardiovascular: Normal rate, regular rhythm. Respiratory: Even unlabored respirations.  Gastrointestinal: No distention.  Musculoskeletal: Ambulatory with steady gait. Neurologic:  Normal speech and language.  Skin:  Skin is warm, dry and intact. No rash noted.  ____________________________________________   PROCEDURES  Procedure(s) performed:   Procedures  None  ____________________________________________   INITIAL IMPRESSION / ASSESSMENT AND PLAN / ED COURSE  Pertinent labs & imaging results that were available during my care of the patient were reviewed by me and considered in my medical decision making (see chart for details).   Patient here requesting a refill of his oxycodone 10 mg tablets.  In review of the prescriber database the patient had a refill of this medication on 09/05/2020.  I discussed the case with the patient.  I told him that I am unable to prescribe additional narcotic medication given that he was just prescribed some 8 days ago.  I understand that he may have lost the prescription but I am unable to provide this medication and directed him back to his primary care doctor's office but advised that they may be under similar restriction.  Offered additional, nonopiate medications such as Robaxin but patient is already taking this.   ____________________________________________  FINAL CLINICAL IMPRESSION(S) / ED DIAGNOSES  Final  diagnoses:  Medication refill     Note:  This document was prepared using Dragon voice recognition software and may include unintentional dictation errors.  11/05/2020, MD, [facility]    Abhijot Straughter, [facility], MD 09/13/20 [phone]

## 2020-09-13 NOTE — ED Notes (Signed)
Entered room to speak with patient, pt not in room. Registration states pt was on phone in lobby telling someone to pick him up, then proceeded to walk down hallway and out of department.

## 2020-09-13 NOTE — ED Triage Notes (Signed)
Pt requesting refill on oxycodone 10mg . States ran out of prescription and doctor is on vacation.

## 2020-10-14 ENCOUNTER — Other Ambulatory Visit: Payer: Self-pay

## 2020-10-14 ENCOUNTER — Emergency Department (HOSPITAL_COMMUNITY)
Admission: EM | Admit: 2020-10-14 | Discharge: 2020-10-16 | Disposition: A | Discharge status: Home / Self Care | Payer: Medicare Other | Attending: Emergency Medicine | Admitting: Emergency Medicine

## 2020-10-14 ENCOUNTER — Encounter (HOSPITAL_COMMUNITY): Payer: Self-pay | Admitting: Emergency Medicine

## 2020-10-14 DIAGNOSIS — F314 Bipolar disorder, current episode depressed, severe, without psychotic features: Secondary | ICD-10-CM | POA: Diagnosis not present

## 2020-10-14 DIAGNOSIS — Z7901 Long term (current) use of anticoagulants: Secondary | ICD-10-CM | POA: Insufficient documentation

## 2020-10-14 DIAGNOSIS — Y900 Blood alcohol level of less than 20 mg/100 ml: Secondary | ICD-10-CM | POA: Diagnosis not present

## 2020-10-14 DIAGNOSIS — R45851 Suicidal ideations: Secondary | ICD-10-CM | POA: Diagnosis not present

## 2020-10-14 DIAGNOSIS — F332 Major depressive disorder, recurrent severe without psychotic features: Secondary | ICD-10-CM | POA: Diagnosis not present

## 2020-10-14 DIAGNOSIS — F419 Anxiety disorder, unspecified: Secondary | ICD-10-CM | POA: Diagnosis present

## 2020-10-14 DIAGNOSIS — G4700 Insomnia, unspecified: Secondary | ICD-10-CM | POA: Diagnosis not present

## 2020-10-14 DIAGNOSIS — Z79899 Other long term (current) drug therapy: Secondary | ICD-10-CM | POA: Insufficient documentation

## 2020-10-14 DIAGNOSIS — Z87891 Personal history of nicotine dependence: Secondary | ICD-10-CM | POA: Insufficient documentation

## 2020-10-14 DIAGNOSIS — Z96643 Presence of artificial hip joint, bilateral: Secondary | ICD-10-CM | POA: Insufficient documentation

## 2020-10-14 DIAGNOSIS — U071 COVID-19: Secondary | ICD-10-CM | POA: Diagnosis not present

## 2020-10-14 LAB — CBC
HCT: 39.4 % (ref 39.0–52.0)
Hemoglobin: 12.5 g/dL — ABNORMAL LOW (ref 13.0–17.0)
MCH: 29.2 pg (ref 26.0–34.0)
MCHC: 31.7 g/dL (ref 30.0–36.0)
MCV: 92.1 fL (ref 80.0–100.0)
Platelets: 282 10*3/uL (ref 150–400)
RBC: 4.28 MIL/uL (ref 4.22–5.81)
RDW: 13 % (ref 11.5–15.5)
WBC: 3.5 10*3/uL — ABNORMAL LOW (ref 4.0–10.5)
nRBC: 0 % (ref 0.0–0.2)

## 2020-10-14 LAB — COMPREHENSIVE METABOLIC PANEL
ALT: 61 U/L — ABNORMAL HIGH (ref 0–44)
AST: 35 U/L (ref 15–41)
Albumin: 3.4 g/dL — ABNORMAL LOW (ref 3.5–5.0)
Alkaline Phosphatase: 116 U/L (ref 38–126)
Anion gap: 7 (ref 5–15)
BUN: 12 mg/dL (ref 6–20)
CO2: 28 mmol/L (ref 22–32)
Calcium: 9.3 mg/dL (ref 8.9–10.3)
Chloride: 100 mmol/L (ref 98–111)
Creatinine, Ser: 0.87 mg/dL (ref 0.61–1.24)
GFR, Estimated: >60 mL/min (ref >60)
Glucose, Bld: 101 mg/dL — ABNORMAL HIGH (ref 70–99)
Potassium: 4.5 mmol/L (ref 3.5–5.1)
Sodium: 135 mmol/L (ref 135–145)
Total Bilirubin: 0.4 mg/dL (ref 0.3–1.2)
Total Protein: 7.4 g/dL (ref 6.5–8.1)

## 2020-10-14 LAB — ETHANOL: Alcohol, Ethyl (B): <10 mg/dL (ref <10)

## 2020-10-14 LAB — RESP PANEL BY RT-PCR (FLU A&B, COVID) ARPGX2
Influenza A by PCR: NEGATIVE
Influenza B by PCR: NEGATIVE
SARS Coronavirus 2 by RT PCR: POSITIVE — AB

## 2020-10-14 LAB — RAPID URINE DRUG SCREEN, HOSP PERFORMED
Amphetamines: NOT DETECTED
Barbiturates: NOT DETECTED
Benzodiazepines: NOT DETECTED
Cocaine: NOT DETECTED
Opiates: NOT DETECTED
Tetrahydrocannabinol: NOT DETECTED

## 2020-10-14 LAB — SALICYLATE LEVEL: Salicylate Lvl: <7 mg/dL — ABNORMAL LOW (ref 7.0–30.0)

## 2020-10-14 LAB — ACETAMINOPHEN LEVEL: Acetaminophen (Tylenol), Serum: <10 ug/mL — ABNORMAL LOW (ref 10–30)

## 2020-10-14 MED ORDER — RISPERIDONE 3 MG PO TABS
8.0000 mg | ORAL_TABLET | Freq: Every day | ORAL | Status: DC
  Administered 2020-10-14: 8 mg via ORAL
  Filled 2020-10-14: qty 2

## 2020-10-14 MED ORDER — ZOLPIDEM TARTRATE 5 MG PO TABS
10.0000 mg | ORAL_TABLET | Freq: Every day | ORAL | Status: DC
  Administered 2020-10-14 – 2020-10-15 (×2): 10 mg via ORAL
  Filled 2020-10-14 (×2): qty 2

## 2020-10-14 MED ORDER — OXYCODONE HCL 5 MG PO TABS
5.0000 mg | ORAL_TABLET | ORAL | Status: DC | PRN
  Administered 2020-10-15 – 2020-10-16 (×7): 5 mg via ORAL
  Filled 2020-10-14 (×7): qty 1

## 2020-10-14 MED ORDER — OXYCODONE-ACETAMINOPHEN 5-325 MG PO TABS
1.0000 | ORAL_TABLET | ORAL | Status: DC | PRN
  Administered 2020-10-14 – 2020-10-16 (×8): 1 via ORAL
  Filled 2020-10-14 (×8): qty 1

## 2020-10-14 MED ORDER — ALPRAZOLAM 0.5 MG PO TABS
1.0000 mg | ORAL_TABLET | Freq: Three times a day (TID) | ORAL | Status: DC | PRN
  Administered 2020-10-15 (×2): 1 mg via ORAL
  Filled 2020-10-14 (×3): qty 2

## 2020-10-14 MED ORDER — CLONAZEPAM 0.5 MG PO TABS
1.0000 mg | ORAL_TABLET | Freq: Every day | ORAL | Status: DC
  Administered 2020-10-14 – 2020-10-15 (×2): 1 mg via ORAL
  Filled 2020-10-14 (×2): qty 2

## 2020-10-14 MED ORDER — METHOCARBAMOL 500 MG PO TABS
750.0000 mg | ORAL_TABLET | Freq: Four times a day (QID) | ORAL | Status: DC | PRN
  Administered 2020-10-14 – 2020-10-16 (×3): 750 mg via ORAL
  Filled 2020-10-14 (×3): qty 2

## 2020-10-14 NOTE — ED Notes (Addendum)
RN to room to speak to patient as patient had just arrived on unit. Patient visibly anxious about plan of care, but very calm and cooperative. Patient given reassurance and educated about process involved in his care. Patient stating he's not suicidal at this time, but was suicidal while at home. Patient stated his SI came from feeling worthless. Patient resting in room and awaiting test results that are needed prior to his transfer to [facility] facility. RN to continue to monitor patient at this time.

## 2020-10-14 NOTE — BH Assessment (Signed)
Comprehensive Clinical Assessment (CCA) Note  10/14/2020 Brian RosenthalJeffrey Orozco [id number]  DISPOSITION: Gave clinical report to Brian BeringShalon Bobbitt, NP who determined Pt meets criteria for inpatient psychiatric treatment. Brian Rail[facility]Ann [facility], [facility], said a bed is available pending COVID test. Notified Dr. Chaney Mallingavid Yao, Sophronia SimasLorren Baldwin, RN, and Nicolasa DuckingBrittany Oldland, RN of recommendation.  The patient demonstrates the following risk factors for suicide: Chronic risk factors for suicide include: psychiatric disorder of bipolar disorder and chronic pain. Acute risk factors for suicide include: recent discharge from inpatient psychiatry. Protective factors for this patient include: positive social support, responsibility to others (children, family), and religious beliefs against suicide. Considering these factors, the overall suicide risk at this point appears to be high. Patient is not appropriate for outpatient follow up.  Flowsheet Row ED [facility] ED from 09/13/2020 in [facility] EM[facility]GENCY DEPARTMENT  C-SSRS RISK CATEGORY High Risk No Risk      Pt is a 55 year old married male who presents unaccompanied to [facility] reporting symptoms of depression, anxiety, and suicidal ideation. Pt's medical recording indicates a history of bipolar disorder and Pt describes his mood as "low" for over a week. Pt reports he has been tearful and has been "having a lot of emotions." He reports recurring suicidal ideation. He says is he was going to kill himself he would hang himself from the hay loft in his barn but says he does not want that to happen. Pt says he asked his [facility] to [facility] because he does not want to act on suicidal thoughts but fears he will. He denies any history of suicide attempts. Pt reports he has not been sleeping. Pt's medical record indicates that Pt has experienced insomnia in the past that has been associated with hallucinations. Pt  denies current auditory or visual hallucinations. Pt's medical record indicates Pt has used marijuana in the past but Pt says he has not used marijuana, alcohol, or any other substances recently. Pt's urine drug screen is negative and blood alcohol level is less than 10.  Pt reports he is experiencing pain in his neck, back, and level hip scaled 7/10. He says he has a history of having both hips replaced and describes his pain as chronic. He denies taking more pain medication than prescribed. He says he can perform all ADLs without assistance and does not use a cane or walker to ambulate. Pt reports he lives with his wife and they have no children. He endorses good family support including his wife, mother, father, and brother. He says he has been on disability due to mental health for 12 years. He denies history of abuse. He denies legal problems. He says he has hunting rifles which are currently locked away.  Pt says he does not have any mental health providers at this time. He says his medications are prescribed by his primary care physician. He says [name] due to depressive symptoms, insomnia, and suicidal ideation. Pt's medical record indicates [facility].  Pt is dressed in Orozco scrubs, alert and oriented x4. Pt speaks in a clear tone, at loud volume and deliberate pace. Motor behavior appears normal. Eye contact is good and at times Pt is tearful. Pt's mood is depressed and anxious, affect is congruent with mood. Thought process is coherent and relevant. There is no indication Pt is  currently responding to internal stimuli or experiencing delusional thought content. Pt was cooperative throughout assessment. He says he fears he will act on suicidal thoughts and is willing to sign voluntarily into a psychiatric facility.   Chief Complaint:  Chief Complaint   Patient presents with   Suicidal   Visit Diagnosis: F31.4 Bipolar I disorder, Current or most recent episode depressed, Severe   CCA Screening, Triage and Referral (STR)  Patient Reported Information How did you hear about [place]? Family/Friend  Referral name: No data recorded Referral phone number: No data recorded  Whom do you see for routine medical problems? No data recorded Practice/Facility Name: No data recorded Practice/Facility Phone Number: No data recorded Name of Contact: No data recorded Contact Number: No data recorded Contact Fax Number: No data recorded Prescriber Name: No data recorded Prescriber Address (if known): No data recorded  What Is the Reason for Your Visit/Call Today? Pt reports he has a history of depression and anxiety. He describes feeling increasingly depressed with suicidal ideation and fears he might harm himself.  How Long Has This Been Causing You Problems? 1 wk - 1 month  What Do You Feel Would Help You the Most Today? Treatment for Depression or other mood problem   Have You Recently Been in Any Inpatient Treatment ([facility]/Detox/Crisis Center/28-Day Program)? No data recorded Name/Location of Program/[facility]:No data recorded How Long Were You There? No data recorded When Were You Discharged? No data recorded  Have You Ever Received Services From [facility] Before? No data recorded Who Do You See at [facility]? No data recorded  Have You Recently Had Any Thoughts About Hurting Yourself? Yes  Are You Planning to Commit Suicide/Harm Yourself At This time? No   Have you Recently Had Thoughts About Hurting Someone [name]? No  Explanation: No data recorded  Have You Used Any Alcohol or Drugs in the Past 24 Hours? No  How Long Ago Did You Use Drugs or Alcohol? No data recorded What Did You Use and How Much? No data recorded  Do You Currently Have a Therapist/Psychiatrist? No  Name of Therapist/Psychiatrist: No data recorded  Have  You Been Recently Discharged From Any Office Practice or Programs? No  Explanation of Discharge From Practice/Program: No data recorded    CCA Screening Triage Referral Assessment Type of Contact: Tele-Assessment  Is this Initial or Reassessment? Initial Assessment  Date Telepsych consult ordered in [facility]:  10/14/20  Time Telepsych consult ordered in [facility]:  1920   Patient Reported Information Reviewed? No data recorded Patient Left Without Being Seen? No data recorded Reason for Not Completing Assessment: No data recorded  Collateral Involvement: None   Does Patient Have a Court Appointed Legal Guardian? No data recorded Name and Contact of Legal Guardian: No data recorded If Minor and Not Living with Parent(s), Who has Custody? NA  Is CPS involved or ever been involved? Never  Is APS involved or ever been involved? Never   Patient Determined To Be At Risk for Harm To Self or Others Based on Review of Patient Reported Information or Presenting Complaint? Yes, for Self-Harm  Method: No data recorded Availability of Means: No data recorded Intent: No data recorded Notification Required: No data recorded Additional Information for Danger to Others Potential: No data recorded Additional Comments for Danger to Others Potential: No data recorded Are There Guns or Other Weapons in Your Home? No data recorded Types of Guns/Weapons: No data recorded Are These Weapons Safely Secured?  No data recorded Who Could Verify You Are Able To Have These Secured: No data recorded Do You Have any Outstanding Charges, Pending Court Dates, Parole/Probation? No data recorded Contacted To Inform of Risk of Harm To Self or Others: Family/Significant Other:   Location of Assessment: [facility] ED   Does Patient Present under Involuntary Commitment? No  IVC Papers Initial File Date: No data recorded  [place] of Residence: [place]   Patient Currently Receiving the Following  Services: Medication Management   Determination of Need: Urgent (48 hours)   Options For Referral: [facility]; Inpatient Hospitalization     CCA Biopsychosocial Intake/Chief Complaint:  No data recorded Current Symptoms/Problems: No data recorded  Patient Reported Schizophrenia/Schizoaffective Diagnosis in Past: No   Strengths: Pt is motivated for treatment and has good family support.  Preferences: No data recorded Abilities: No data recorded  Type of Services Patient Feels are Needed: No data recorded  Initial Clinical Notes/Concerns: No data recorded  Mental Health Symptoms Depression:   Change in energy/activity; Sleep (too much or little); Fatigue   Duration of Depressive symptoms:  Less than two weeks   Mania:   Change in energy/activity   Anxiety:    Fatigue; Sleep; Tension; Worrying   Psychosis:   None   Duration of Psychotic symptoms: No data recorded  Trauma:   None   Obsessions:   None   Compulsions:   None   Inattention:   [name]   Hyperactivity/Impulsivity:   [name]   Oppositional/Defiant Behaviors:   [name]   Emotional Irregularity:   None   Other Mood/Personality Symptoms:   NA    Mental Status Exam Appearance and self-care  Stature:   Average   Weight:   Average weight   Clothing:   -- (Scrubs)   Grooming:   Normal   Cosmetic use:   None   Posture/gait:   Normal   Motor activity:   Not Remarkable   Sensorium  Attention:   Normal   Concentration:   Normal   Orientation:   X5   Recall/memory:   Normal   Affect and Mood  Affect:   Depressed   Mood:   Depressed   Relating  Eye contact:   Normal   Facial expression:   Depressed; Anxious   Attitude toward examiner:   Cooperative   Thought and Language  Speech flow:  Clear and Coherent; Loud   Thought content:   Appropriate to Mood and Circumstances   Preoccupation:   None   Hallucinations:   None   Organization:  No data  recorded  [employer] of Knowledge:   Average   Intelligence:   Average   Abstraction:   Concrete   Judgement:   Fair   Reality Testing:   Adequate   Insight:   Fair   Decision Making:   Only simple   Social Functioning  Social Maturity:   Responsible   Social Judgement:   Normal   Stress  Stressors:   Illness   Coping Ability:   Contractor Deficits:   None   Supports:   Family     Religion: Religion/Spirituality Are You A Religious Person?: Yes What is Your Religious Affiliation?: Christian How Might This Affect Treatment?: Pt identifies Christian beliefs as deterrent to suicide.  Leisure/Recreation: Leisure / Recreation Do You Have Hobbies?: Yes Leisure and Hobbies: Architect  Exercise/Diet: Exercise/Diet Do You Exercise?: Yes What Type of Exercise Do You Do?: Run/Walk How Many Times  a Week Do You Exercise?: 1-3 times a week Have You Gained or Lost A Significant Amount of Weight in the Past Six Months?: Yes-Lost Number of Pounds Lost?: 5 Do You Follow a Special Diet?: Yes Type of Diet: Reduce red meat, fried foods, and refined sugar. Do You Have Any Trouble Sleeping?: Yes Explanation of Sleeping Difficulties: Pt reports sleeping 2 hours per night.   CCA Employment/Education Employment/Work Situation: Employment / Work Systems developer: On disability Why is Patient on Disability: Mental health How Long has Patient Been on Disability: 12 years Patient's Job has Been Impacted by Current Illness: No Has Patient ever Been in the [employer]?: No  Education: Education Is Patient Currently Attending School?: No Last Grade Completed: 14 Did You Attend College?: Yes What Type of College Degree Do you Have?: Associates degree Did You Have An Individualized Education Program (IIEP): No Did You Have Any Difficulty At School?: No Patient's Education Has Been Impacted by Current Illness: No   CCA  Family/Childhood History Family and Relationship History: Family history Marital status: Married What types of issues is patient dealing with in the relationship?: Pt denies marital problems. Does patient have children?: No  Childhood History:  Childhood History By whom was/is the patient raised?: Both parents Did patient suffer any verbal/emotional/physical/sexual abuse as a child?: No Did patient suffer from severe childhood neglect?: No Has patient ever been sexually abused/assaulted/raped as an adolescent or adult?: No Was the patient ever a victim of a crime or a disaster?: No Witnessed domestic violence?: No Has patient been affected by domestic violence as an adult?: No  Child/Adolescent Assessment:     CCA Substance Use Alcohol/Drug Use:                           ASAM's:  Six Dimensions of Multidimensional Assessment  Dimension 1:  Acute Intoxication and/or Withdrawal Potential:      Dimension 2:  Biomedical Conditions and Complications:      Dimension 3:  Emotional, Behavioral, or Cognitive Conditions and Complications:     Dimension 4:  Readiness to Change:     Dimension 5:  Relapse, Continued use, or Continued Problem Potential:     Dimension 6:  Recovery/Living Environment:     ASAM Severity Score:    ASAM Recommended Level of Treatment:     Substance use Disorder (SUD)    Recommendations for Services/Supports/Treatments:    DSM5 Diagnoses: Patient Active Problem List   Diagnosis Date Noted   Tobacco use disorder 04/21/2013   Left hip pain 03/08/2013   Avascular necrosis of bone of left hip (HCC) 02/25/2013   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   Acute posthemorrhagic anemia 12/27/2012   Hip pain, acute 12/27/2012   GERD (gastroesophageal reflux disease) 11/24/2012   Insomnia 11/24/2012   Carotid artery stenosis 11/24/2012   Avascular necrosis of bone of right hip (HCC) 11/19/2012   Abdominal pain 12/16/2011   Chronic  pancreatitis (HCC) 12/16/2011   Anxiety 12/16/2011   Depression 12/16/2011    Patient Centered Plan: Patient is on the following Treatment Plan(s):  Anxiety and Depression   Referrals to Alternative Service(s): Referred to Alternative Service(s):   Place:   Date:   Time:    Referred to Alternative Service(s):   Place:   Date:   Time:    Referred to Alternative Service(s):   Place:   Date:   Time:    Referred to Alternative Service(s):  Place:   Date:   Time:     Evelena Peat, [facility]

## 2020-10-14 NOTE — ED Provider Notes (Signed)
[facility]   CSN: [id number] Arrival date & time: 10/14/20  1719     History Chief Complaint  Patient presents with   Suicidal    Myles RosenthalJeffrey Hardacre is a 55 y.o. male history of anxiety, PTSD, chronic pain on pain medicine here presenting with depression.  Patient states that he just feels like he does not want to live anymore.  Cannot give me much details.  He states that he was admitted to psychiatry several months ago for similar suicidal ideations.  He denies taking too much of his chronic pain meds or drinking alcohol.  The history is provided by the patient.      Past Medical History:  Diagnosis Date   Anxiety    Arthritis    avascular necrosis bilateral hips - right hip pain usally hurts worse.   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   Chronic abdominal pain    Depression    GERD (gastroesophageal reflux disease)    Insomnia    Neck pain    pt states he has 3 bulging disks -no surgery   PTSD (post-traumatic stress disorder)    Sleep apnea    has cpap - but does not use-does not know setting-doesnot tolerate mask   Smoking    Tachycardia    pt states his heart rate may be elevated because of anxiety over planned surgery    Patient Active Problem List   Diagnosis Date Noted   Tobacco use disorder 04/21/2013   Left hip pain 03/08/2013   Avascular necrosis of bone of left hip (HCC) 02/25/2013   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   Acute posthemorrhagic anemia 12/27/2012   Hip pain, acute 12/27/2012   GERD (gastroesophageal reflux disease) 11/24/2012   Insomnia 11/24/2012   Carotid artery stenosis 11/24/2012   Avascular necrosis of bone of right hip (HCC) 11/19/2012   Abdominal pain 12/16/2011   Chronic pancreatitis (HCC)  12/16/2011   Anxiety 12/16/2011   Depression 12/16/2011    Past Surgical History:  Procedure Laterality Date   CHOLECYSTECTOMY     COLONOSCOPY     TOTAL HIP ARTHROPLASTY Right 11/19/2012   Procedure: RIGHT TOTAL HIP ARTHROPLASTY ANTERIOR APPROACH;  Surgeon: Kathryne Hitchhristopher Y Blackman, MD;  Location: [facility];  Service: Orthopedics;  Laterality: Right;   TOTAL HIP ARTHROPLASTY Left 02/25/2013   Procedure: LEFT TOTAL HIP ARTHROPLASTY ANTERIOR APPROACH;  Surgeon: Kathryne Hitchhristopher Y Blackman, MD;  Location: [facility];  Service: Orthopedics;  Laterality: Left;       Family History  Problem Relation Age of Onset   Diabetes Mother     Social History   Tobacco Use   Smoking status: Former    Packs/day: 1.00    Years: 31.00    Pack years: 31.00    Types: Cigarettes   Smokeless tobacco: Former  Building services engineerVaping Use   Vaping Use: Never used  Substance Use Topics   Alcohol use: Not Currently    Comment: once a week - a beer---states he no longer uses marijuana and denies use of any other recreational drugs/ [facility] last drug screen 10/22/12 positive for marijuana   Drug use: No    Comment: previous history of THC use    Home Medications Prior to Admission  medications   Medication Sig Start Date End Date Taking? Authorizing Provider  albuterol (PROVENTIL HFA;VENTOLIN HFA) 108 (90 BASE) MCG/ACT inhaler Inhale 1-2 puffs into the lungs every 6 (six) hours as needed for wheezing or shortness of breath. 01/13/15   Hedges, Tinnie Gens, PA-C  ALPRAZolam Prudy Feeler) 1 MG tablet Take 1 tablet (1 mg total) by mouth 3 (three) times daily as needed for anxiety. 03/02/13   Sharon Seller, NP  aspirin EC 325 MG EC tablet Take 1 tablet (325 mg total) by mouth 2 (two) times daily after a meal. Patient not taking: Reported on 01/13/2015 02/28/13   Kirtland Bouchard, PA-C  clonazePAM (KLONOPIN) 1 MG tablet Take 1 mg by mouth at bedtime.    [provider]  dicyclomine (BENTYL) 20  MG tablet Take 20 mg by mouth every 6 (six) hours as needed for spasms.     [provider]  ELIQUIS 5 MG TABS tablet Take 5 mg by mouth 2 (two) times daily. 01/04/15   [provider]  esomeprazole (NEXIUM) 20 MG capsule Take 40 mg by mouth daily at 12 noon.    [provider]  gabapentin (NEURONTIN) 300 MG capsule Take 1 capsule (300 mg total) by mouth at bedtime. Patient not taking: Reported on 01/13/2015 03/01/13   Kathryne Hitch, MD  methocarbamol (ROBAXIN) 500 MG tablet Take 1 tablet (500 mg total) by mouth every 6 (six) hours as needed for muscle spasms. Patient not taking: Reported on 01/13/2015 02/28/13   Kirtland Bouchard, PA-C  methocarbamol (ROBAXIN) 750 MG tablet Take 750 mg by mouth 4 (four) times daily as needed for muscle spasms.     [provider]  OxyCODONE (OXYCONTIN) 15 mg T12A 12 hr tablet Take one tablet by mouth at 8am,12pm,4pm,and 8pm as needed for pain Patient not taking: Reported on 01/13/2015 03/07/13   Renato Gails, Tiffany L, DO  oxyCODONE (ROXICODONE) 15 MG immediate release tablet Take 1 tablet (15 mg total) by mouth every 6 (six) hours as needed for pain. Patient taking differently: Take 15 mg by mouth every 6 (six) hours.  02/28/13   Kirtland Bouchard, PA-C  OxyCODONE HCl ER (OXYCONTIN) 30 MG T12A Take one tablet by mouth every 12 hours. Do not crush Patient not taking: Reported on 01/13/2015 03/02/13   Sharon Seller, NP  risperidone (RISPERDAL) 4 MG tablet Take 8 mg by mouth at bedtime.    [provider]  zolpidem (AMBIEN CR) 12.5 MG CR tablet Take 1 tablet (12.5 mg total) by mouth at bedtime. sleep Patient not taking: Reported on 01/13/2015 11/22/12   Sharon Seller, NP  zolpidem (AMBIEN) 10 MG tablet Take 10 mg by mouth at bedtime.    [provider]    Allergies    Antihistamines, chlorpheniramine-type; Levaquin [levofloxacin]; and Prednisone  Review of Systems   Review of Systems   Psychiatric/Behavioral:  Positive for dysphoric mood and suicidal ideas.   All other systems reviewed and are negative.  Physical Exam Updated Vital Signs BP 117/82 (BP Location: Right Arm)   Pulse 84   Temp 98.5 F (36.9 C) (Oral)   Resp 14   SpO2 97%   Physical Exam Vitals and nursing note reviewed.  Constitutional:      Comments: Depressed  HENT:     Head: Normocephalic.     Nose: Nose normal.     Mouth/Throat:     Mouth: Mucous membranes are moist.  Eyes:     Extraocular Movements: Extraocular  movements intact.     Pupils: Pupils are equal, round, and reactive to light.  Cardiovascular:     Rate and Rhythm: Normal rate and regular rhythm.     Pulses: Normal pulses.     Heart sounds: Normal heart sounds.  Pulmonary:     Effort: Pulmonary effort is normal.     Breath sounds: Normal breath sounds.  Abdominal:     General: Abdomen is flat.     Palpations: Abdomen is soft.  Musculoskeletal:        General: Normal range of motion.     Cervical back: Normal range of motion and neck supple.  Skin:    General: Skin is warm.     Capillary Refill: Capillary refill takes less than 2 seconds.  Neurological:     General: No focal deficit present.     Mental Status: He is alert and oriented to person, place, and time.  Psychiatric:     Comments: depressed    ED Results / Procedures / Treatments   Labs (all labs ordered are listed, but only abnormal results are displayed) Labs Reviewed  COMPREHENSIVE METABOLIC PANEL - Abnormal; Notable for the following components:      Result Value   Glucose, Bld 101 (*)    Albumin 3.4 (*)    ALT 61 (*)    All other components within normal limits  SALICYLATE LEVEL - Abnormal; Notable for the following components:   Salicylate Lvl <7.0 (*)    All other components within normal limits  ACETAMINOPHEN LEVEL - Abnormal; Notable for the following components:   Acetaminophen (Tylenol), Serum <10 (*)    All other components within normal  limits  CBC - Abnormal; Notable for the following components:   WBC 3.5 (*)    Hemoglobin 12.5 (*)    All other components within normal limits  RESP PANEL BY RT-PCR (FLU A&B, COVID) ARPGX2  ETHANOL  RAPID URINE DRUG SCREEN, HOSP PERFORMED    EKG None  Radiology No results found.  Procedures Procedures   Medications Ordered in ED Medications  oxyCODONE-acetaminophen (PERCOCET/ROXICET) 5-325 MG per tablet 1 tablet (1 tablet Oral Given 10/14/20 2003)    And  oxyCODONE (Oxy IR/ROXICODONE) immediate release tablet 5 mg (has no administration in time range)  ALPRAZolam (XANAX) tablet 1 mg (has no administration in time range)  clonazePAM (KLONOPIN) tablet 1 mg (has no administration in time range)  methocarbamol (ROBAXIN) tablet 750 mg (has no administration in time range)  risperiDONE (RISPERDAL) tablet 8 mg (has no administration in time range)  zolpidem (AMBIEN) tablet 10 mg (has no administration in time range)    ED Course  I have reviewed the triage vital signs and the nursing notes.  Pertinent labs & imaging results that were available during my care of the patient were reviewed by me and considered in my medical decision making (see chart for details).    MDM Rules/Calculators/A&P                         Gedalia Mcmillon is a 55 y.o. male here presenting with depressed mood and vague suicidal ideations.  Patient has no definite plan.  Will consult psych given that he has recent psych admission.  10:39 PM Psych recommended psych admission.  Medically cleared at this point.  Home meds ordered.    Final Clinical Impression(s) / ED Diagnoses Final diagnoses:  None    Rx / DC Orders ED Discharge Orders  None        Charlynne Pander, MD 10/14/20 2239

## 2020-10-14 NOTE — ED Notes (Signed)
Pt placed in blue disposable scrubs as purple scrubs in pt's size were not available. RN aware.

## 2020-10-14 NOTE — ED Notes (Signed)
telephysch speaking with pt

## 2020-10-14 NOTE — ED Triage Notes (Signed)
Patient coming from home, complaint of SI for two days, no specific plan or intent.

## 2020-10-15 ENCOUNTER — Encounter (HOSPITAL_COMMUNITY): Payer: Self-pay | Admitting: Registered Nurse

## 2020-10-15 DIAGNOSIS — R45851 Suicidal ideations: Secondary | ICD-10-CM

## 2020-10-15 DIAGNOSIS — F332 Major depressive disorder, recurrent severe without psychotic features: Secondary | ICD-10-CM

## 2020-10-15 DIAGNOSIS — F419 Anxiety disorder, unspecified: Secondary | ICD-10-CM

## 2020-10-15 DIAGNOSIS — G4700 Insomnia, unspecified: Secondary | ICD-10-CM

## 2020-10-15 MED ORDER — ALPRAZOLAM 0.5 MG PO TABS
0.5000 mg | ORAL_TABLET | Freq: Three times a day (TID) | ORAL | Status: DC | PRN
  Administered 2020-10-15 – 2020-10-16 (×2): 0.5 mg via ORAL
  Filled 2020-10-15 (×2): qty 1

## 2020-10-15 MED ORDER — RISPERIDONE 1 MG PO TABS
2.0000 mg | ORAL_TABLET | Freq: Every day | ORAL | Status: DC
  Administered 2020-10-15: 2 mg via ORAL
  Filled 2020-10-15: qty 2

## 2020-10-15 MED ORDER — AMITRIPTYLINE HCL 25 MG PO TABS
25.0000 mg | ORAL_TABLET | Freq: Every day | ORAL | Status: DC
  Administered 2020-10-15: 25 mg via ORAL
  Filled 2020-10-15: qty 1

## 2020-10-15 NOTE — ED Notes (Signed)
Pt came out of room complaining of neck and shoulder pain. Requesting something for pain and to help calm him down.

## 2020-10-15 NOTE — ED Provider Notes (Signed)
Emergency Medicine Observation Re-evaluation Note  [name] is a 55 y.o. male, seen on rounds today.  Pt initially presented to the ED for complaints of Suicidal Currently, the patient is seen standing at the nurses desk.  Physical Exam  BP 130/86 (BP Location: Left Arm)   Pulse (!) 119   Temp 97.9 F (36.6 C) (Oral)   Resp 15   SpO2 96%  Physical Exam Constitutional:      Appearance: He is not toxic-appearing.  Cardiovascular:     Rate and Rhythm: Normal rate.  Pulmonary:     Effort: Pulmonary effort is normal.  Neurological:     Mental Status: He is alert.  Psychiatric:        Mood and Affect: Mood normal.    ED Course / MDM  EKG:   I have reviewed the labs performed to date as well as medications administered while in observation.  Recent changes in the last 24 hours include [name]. Pt has been evaluated by psych who recommends inpatient admission at this time.  Plan  Current plan is for pending psych placement. No available beds at [facility]. Patient is not under full IVC at this time.   Tanda Rockers, PA-C 10/15/20 1540    Arby Barrette, MD 10/16/20 1150

## 2020-10-15 NOTE — Progress Notes (Signed)
Patient has been referred out due to no bed available at [facility]. Patient meets inpatient criteria per [facility],NP. Patient referred to the following facilities:  [facility]  [address], [place] [address] [phone]  [facility] [address]  [address], [place] [address] [phone]  [facility]  [address] [place] [address] [phone]  [facility]  [address] [place], [place] [address] [phone]  [facility]  [address], [place] [address] [phone]  [facility]  [address] [place], [place] [address] [phone]  [facility]  [address], [place] [address] [phone]  [facility]  [address], [place] [address] [phone]  [facility]  [address] [place] [address] [place] [address] [phone]  [facility]  [address] [place] [address] [phone]  [facility]  [address], [place] [address] [phone]  [facility]  [address] [place] [address] [phone]  [facility]  [address] [place] [address] [phone]  [facility]  [address], [place] [address] [phone]  [facility]  [address] [place], [place] [address] [phone]  [facility]  [address], [place] [address] [phone]  [facility]  [address] [place] [address] [phone]     CSW will continue to monitor disposition.     Damita Dunnings, MSW, LCSW-A  7:22 AM 10/15/2020

## 2020-10-15 NOTE — Consult Note (Addendum)
Telepsych Consultation   Reason for Consult:  Depression, Anxiety, and suicidal ideation Referring Physician:  Charlynne Pander, MD Location of Patient: [facility]  ED Location of Provider: Other: [facility]  Patient Identification: [name] MRN:  [id number] Principal Diagnosis: MDD (major depressive disorder), recurrent severe, without psychosis (HCC) Diagnosis:  Principal Problem:   MDD (major depressive disorder), recurrent severe, without psychosis (HCC) Active Problems:   Anxiety   Insomnia   Total Time spent with patient: 30 minutes  Subjective:   [name] is a 55 y.o. male patient admitted [facility] ED after presenting with complaints of depression, anxiety, and suicidal ideation.  Medical record indicates a history of bipolar disorder.      HPI:  [name], 55 y.o., male patient seen via tele health by this provider, consulted with Dr. Nelly Rout; and chart reviewed on 10/15/20.  On evaluation [name] reports he came to the emergency room because he was feeling overwhelmed.  I just got run down taking care of my parents.  They're from [place] and ended up staying with me a little longer than they normally do.  My brother usually brings him had not keep him for a week or 2 but this time they were with me for 4 months.  They are gone back now but while they was here I was not able to take care of myself I am out of my meds.  My doctor told me I was not even supposed to be taking care of him.  That it is too much stress."  Patient states he needs to get back on his medications and needs prescriptions for his medications.  When asked what medications that he be prescription for he reported his Ambien Klonopin and amitriptyline.  Patient asked if he was continuing to have suicidal thoughts and he stated "I've had thoughts.  And that is the reason I came to the hospital.  I do not want things to get worse could not they are and I do not want to act on my thoughts.  I am not able  to take my medicines or anything, all right now.  And I then wanted do anything in front of my family that would hurt them."  Patient is unable to contract for safety if he was to be discharged home.  Patient denies prior suicide attempt.  Patient is also reporting that he is not sleeping.  Reporting he takes Lunesta at home switching back and forth between Ambien and Lunesta.  Patient reports he is unable to do trazodone for sleep.  Patient denies homicidal ideation, auditory/visual hallucinations, and paranoia. During evaluation [name] is sitting on side of bed in no acute distress.  He is alert, oriented x 4, calm and cooperative.  His mood is anxious and dysphoric with congruent affect.  He does not appear to be responding to internal/external stimuli or delusional thoughts.  Patient denies suicidal/self-harm/homicidal ideation, psychosis, and paranoia.  Patient answered question appropriately.   Patient is COVID-positive discussed intensive outpatient or partial hospitalization but patient states he has no computer at home or a smart phone so would be unable to participate in virtual services.  Patient also reported he is unable to contract for safety.  Will continue to seek inpatient psychiatric treatment but after significant period of isolation/quarantine related to COVID.  Patient also informed medication adjustments and treatment was started while he was in the ED and possibility he may be better before psychiatric bed was found related to  him being COVID-positive.  Patient reporting on multiple medications in the past.  For anxiety states he is unable to take Buspar, Vistaril, or Seroquel.  Reports he has been on the Risperdal for a long time and stopped it himself but primary provider is aware.  States he was restarted on Amitriptyline but has missed dose for last 4 days "it is the only medicine that has been able to help."   PDMP show that patient recently filled on 10/08/20 Clonazepam 1 mg  90 tablets, Eszopiclone 30 mg 30 tablets, Oxycodone Hcl (Ir) 10 mg 120 tablets.  Patient states he doesn't remember getting medications filled. Stating he needed prescriptions for Amitriptyline, Lunesta, and Klonopin.      Past Psychiatric History: Depression, insomnia, suicidal ideation.  One prior psychiatric admission 01/2014  Risk to Self: Yes Risk to Others: Denies Prior Inpatient Therapy: Reports 1 prior psychiatric hospitalization at [facility] in 2015 for similar presentation of depression, anxiety, and suicidal ideation. Prior Outpatient Therapy:    Past Medical History:  Past Medical History:  Diagnosis Date   Anxiety    Arthritis    avascular necrosis bilateral hips - right hip pain usally hurts worse.   neck pain-has bulging disks in his neck -   Carotid artery stenosis    "MILD, LESS THAN OR EQUAL TO 39%, LEFT AND RIGHT INTERNAL CAROTID ARTERY STENOSIS" - PER ULTRASOUND REPORT 09/28/12 [facility].   Chronic abdominal pain    Depression    GERD (gastroesophageal reflux disease)    Insomnia    Neck pain    pt states he has 3 bulging disks -no surgery   PTSD (post-traumatic stress disorder)    Sleep apnea    has cpap - but does not use-does not know setting-doesnot tolerate mask   Smoking    Tachycardia    pt states his heart rate may be elevated because of anxiety over planned surgery    Past Surgical History:  Procedure Laterality Date   CHOLECYSTECTOMY     COLONOSCOPY     TOTAL HIP ARTHROPLASTY Right 11/19/2012   Procedure: RIGHT TOTAL HIP ARTHROPLASTY ANTERIOR APPROACH;  Surgeon: Kathryne Hitch, MD;  Location: [facility];  Service: Orthopedics;  Laterality: Right;   TOTAL HIP ARTHROPLASTY Left 02/25/2013   Procedure: LEFT TOTAL HIP ARTHROPLASTY ANTERIOR APPROACH;  Surgeon: Kathryne Hitch, MD;  Location: [facility];  Service: Orthopedics;  Laterality: Left;   Family History:  Family History  Problem Relation Age of Onset   Diabetes Mother     Family Psychiatric  History: None reported Social History:  Social History   Substance and Sexual Activity  Alcohol Use Not Currently   Comment: once a week - a beer---states he no longer uses marijuana and denies use of any other recreational drugs/ office notes obtained from [facility] - last drug screen 10/22/12 positive for marijuana     Social History   Substance and Sexual Activity  Drug Use No   Comment: previous history of THC use    Social History   Socioeconomic History   Marital status: Married    Spouse name: Not on file   Number of children: Not on file   Years of education: Not on file   Highest education level: Not on file  Occupational History   Not on file  Tobacco Use   Smoking status: Former    Packs/day: 1.00    Years: 31.00    Pack years: 31.00  Types: Cigarettes   Smokeless tobacco: Former  Building services engineer Use: Never used  Substance and Sexual Activity   Alcohol use: Not Currently    Comment: once a week - a beer---states he no longer uses marijuana and denies use of any other recreational drugs/ office notes obtained from [facility] - last drug screen 10/22/12 positive for marijuana   Drug use: No    Comment: previous history of THC use   Sexual activity: Yes  Other Topics Concern   Not on file  Social History Narrative   Not on file   Social Determinants of Health   Financial Resource Strain: Not on file  Food Insecurity: Not on file  Transportation Needs: Not on file  Physical Activity: Not on file  Stress: Not on file  Social Connections: Not on file   Additional Social History:    Allergies:   Allergies  Allergen Reactions   Antihistamines, Chlorpheniramine-Type Other (See Comments)    All antihistamines-Behavior issues   Levaquin [Levofloxacin]     disorientation   Prednisone Other (See Comments)    anxiety    Labs:  Results for orders placed or performed during the hospital encounter of  10/14/20 (from the past 48 hour(s))  Comprehensive metabolic panel     Status: Abnormal   Collection Time: 10/14/20  5:29 PM  Result Value Ref Range   Sodium 135 135 - 145 mmol/L   Potassium 4.5 3.5 - 5.1 mmol/L   Chloride 100 98 - 111 mmol/L   CO2 28 22 - 32 mmol/L   Glucose, Bld 101 (H) 70 - 99 mg/dL    Comment: Glucose reference range applies only to samples taken after fasting for at least 8 hours.   BUN 12 6 - 20 mg/dL   Creatinine, Ser 1.61 0.61 - 1.24 mg/dL   Calcium 9.3 8.9 - 09.6 mg/dL   Total Protein 7.4 6.5 - 8.1 g/dL   Albumin 3.4 (L) 3.5 - 5.0 g/dL   AST 35 15 - 41 U/L   ALT 61 (H) 0 - 44 U/L   Alkaline Phosphatase 116 38 - 126 U/L   Total Bilirubin 0.4 0.3 - 1.2 mg/dL   GFR, Estimated >04 >54 mL/min    Comment: (NOTE) Calculated using the CKD-EPI Creatinine Equation (2021)    Anion gap 7 5 - 15    Comment: Performed at [facility], [address] [place], [place] [address]  Ethanol     Status: None   Collection Time: 10/14/20  5:29 PM  Result Value Ref Range   Alcohol, Ethyl (B) <10 <10 mg/dL    Comment: (NOTE) Lowest detectable limit for serum alcohol is 10 mg/dL.  For medical purposes only. Performed at [facility], [address], [place], [place] [address]   Salicylate level     Status: Abnormal   Collection Time: 10/14/20  5:29 PM  Result Value Ref Range   Salicylate Lvl <7.0 (L) 7.0 - 30.0 mg/dL    Comment: Performed at [facility], [address] [place], [place] [address]  Acetaminophen level     Status: Abnormal   Collection Time: 10/14/20  5:29 PM  Result Value Ref Range   Acetaminophen (Tylenol), Serum <10 (L) 10 - 30 ug/mL    Comment: (NOTE) Therapeutic concentrations vary significantly. A range of 10-30 ug/mL  may be an effective concentration for many patients. However, some  are best treated at concentrations outside of this  range. Acetaminophen concentrations >150 ug/mL at 4 hours after ingestion  and >50 ug/mL at  12 hours after ingestion are often associated with  toxic reactions.  Performed at [facility], [address] [place], [place] [address]   cbc     Status: Abnormal   Collection Time: 10/14/20  5:29 PM  Result Value Ref Range   WBC 3.5 (L) 4.0 - 10.5 K/uL   RBC 4.28 4.22 - 5.81 MIL/uL   Hemoglobin 12.5 (L) 13.0 - 17.0 g/dL   HCT 60.439.4 54.039.0 - 98.152.0 %   MCV 92.1 80.0 - 100.0 fL   MCH 29.2 26.0 - 34.0 pg   MCHC 31.7 30.0 - 36.0 g/dL   RDW 19.113.0 47.811.5 - 29.515.5 %   Platelets 282 150 - 400 K/uL   nRBC 0.0 0.0 - 0.2 %    Comment: Performed at [facility], [address] [place], [place] [address]  Rapid urine drug screen (hospital performed)     Status: None   Collection Time: 10/14/20  5:29 PM  Result Value Ref Range   Opiates NONE DETECTED NONE DETECTED   Cocaine NONE DETECTED NONE DETECTED   Benzodiazepines NONE DETECTED NONE DETECTED   Amphetamines NONE DETECTED NONE DETECTED   Tetrahydrocannabinol NONE DETECTED NONE DETECTED   Barbiturates NONE DETECTED NONE DETECTED    Comment: (NOTE) DRUG SCREEN FOR MEDICAL PURPOSES ONLY.  IF CONFIRMATION IS NEEDED FOR ANY PURPOSE, NOTIFY LAB WITHIN 5 DAYS.  LOWEST DETECTABLE LIMITS FOR URINE DRUG SCREEN Drug Class                     Cutoff (ng/mL) Amphetamine and metabolites    1000 Barbiturate and metabolites    200 Benzodiazepine                 200 Tricyclics and metabolites     300 Opiates and metabolites        300 Cocaine and metabolites        300 THC                            50 Performed at [facility], [address] [place], [place] [address]   Resp Panel by RT-PCR (Flu A&B, Covid) Nasopharyngeal Swab     Status: Abnormal   Collection Time: 10/14/20  8:35 PM   Specimen: Nasopharyngeal Swab; Nasopharyngeal(NP) swabs in vial transport medium  Result Value Ref Range   SARS Coronavirus 2 by RT PCR POSITIVE (A) NEGATIVE    Comment: RESULT CALLED TO, READ BACK BY AND VERIFIED WITH: Thornell MuleK. WILLIAMS RN, AT  2317 10/14/20 D. VANHOOK (NOTE) SARS-CoV-2 target nucleic acids are DETECTED.  The SARS-CoV-2 RNA is generally detectable in upper respiratory specimens during the acute phase of infection. Positive results are indicative of the presence of the identified virus, but do not rule out bacterial infection or co-infection with other pathogens not detected by the test. Clinical correlation with patient history and other diagnostic information is necessary to determine patient infection status. The expected result is Negative.  Fact Sheet for Patients: BloggerCourse.comhttps://www.fda.gov/media/152166/download  Fact Sheet for Healthcare Providers: SeriousBroker.ithttps://www.fda.gov/media/152162/download  This test is not yet approved or cleared by the [place] FDA and  has been authorized for detection and/or diagnosis of SARS-CoV-2 by FDA under an Emergency Use Authorization (EUA).  This EUA will remain in effect (meaning this te st can be used) for the duration  of  the COVID-19 declaration under Section 564(b)(1) of the Act, 21 U.S.C. section 360bbb-3(b)(1), unless the authorization is terminated or revoked sooner.     Influenza A by PCR NEGATIVE NEGATIVE   Influenza B by PCR NEGATIVE NEGATIVE    Comment: (NOTE) The Xpert Xpress SARS-CoV-2/FLU/RSV plus assay is intended as an aid in the diagnosis of influenza from Nasopharyngeal swab specimens and should not be used as a sole basis for treatment. Nasal washings and aspirates are unacceptable for Xpert Xpress SARS-CoV-2/FLU/RSV testing.  Fact Sheet for Patients: BloggerCourse.com  Fact Sheet for Healthcare Providers: SeriousBroker.it  This test is not yet approved or cleared by the [place] FDA and has been authorized for detection and/or diagnosis of SARS-CoV-2 by FDA under an Emergency Use Authorization (EUA). This EUA will remain in effect (meaning this test can be used) for the duration of  the COVID-19 declaration under Section 564(b)(1) of the Act, 21 U.S.C. section 360bbb-3(b)(1), unless the authorization is terminated or revoked.  Performed at [facility], [address], [place], [place] [address]     Medications:  Current Facility-Administered Medications  Medication Dose Route Frequency Provider Last Rate Last Admin   ALPRAZolam Prudy Feeler) tablet 0.5 mg  0.5 mg Oral TID PRN Rodriguez Aguinaldo B, NP       amitriptyline (ELAVIL) tablet 25 mg  25 mg Oral QHS Wilgus Deyton B, NP       clonazePAM (KLONOPIN) tablet 1 mg  1 mg Oral QHS Charlynne Pander, MD   1 mg at 10/14/20 2302   methocarbamol (ROBAXIN) tablet 750 mg  750 mg Oral QID PRN Charlynne Pander, MD   750 mg at 10/15/20 0412   oxyCODONE-acetaminophen (PERCOCET/ROXICET) 5-325 MG per tablet 1 tablet  1 tablet Oral Q4H PRN Charlynne Pander, MD   1 tablet at 10/15/20 1215   And   oxyCODONE (Oxy IR/ROXICODONE) immediate release tablet 5 mg  5 mg Oral Q4H PRN Charlynne Pander, MD   5 mg at 10/15/20 1215   risperiDONE (RISPERDAL) tablet 2 mg  2 mg Oral QHS Earnest Thalman B, NP       zolpidem (AMBIEN) tablet 10 mg  10 mg Oral QHS Charlynne Pander, MD   10 mg at 10/14/20 2303   Current Outpatient Medications  Medication Sig Dispense Refill   albuterol (PROVENTIL HFA;VENTOLIN HFA) 108 (90 BASE) MCG/ACT inhaler Inhale 1-2 puffs into the lungs every 6 (six) hours as needed for wheezing or shortness of breath. 1 Inhaler 0   ALPRAZolam (XANAX) 1 MG tablet Take 1 tablet (1 mg total) by mouth 3 (three) times daily as needed for anxiety. 90 tablet 5   aspirin EC 325 MG EC tablet Take 1 tablet (325 mg total) by mouth 2 (two) times daily after a meal. (Patient not taking: Reported on 01/13/2015) 45 tablet 0   clonazePAM (KLONOPIN) 1 MG tablet Take 1 mg by mouth at bedtime.     dicyclomine (BENTYL) 20 MG tablet Take 20 mg by mouth every 6 (six) hours as needed for spasms.      ELIQUIS 5 MG TABS tablet Take 5 mg by mouth  2 (two) times daily.     esomeprazole (NEXIUM) 20 MG capsule Take 40 mg by mouth daily at 12 noon.     gabapentin (NEURONTIN) 300 MG capsule Take 1 capsule (300 mg total) by mouth at bedtime. (Patient not taking: Reported on 01/13/2015) 30 capsule 0   methocarbamol (ROBAXIN) 500 MG tablet  Take 1 tablet (500 mg total) by mouth every 6 (six) hours as needed for muscle spasms. (Patient not taking: Reported on 01/13/2015) 40 tablet 0   methocarbamol (ROBAXIN) 750 MG tablet Take 750 mg by mouth 4 (four) times daily as needed for muscle spasms.      OxyCODONE (OXYCONTIN) 15 mg T12A 12 hr tablet Take one tablet by mouth at 8am,12pm,4pm,and 8pm as needed for pain (Patient not taking: Reported on 01/13/2015) 120 tablet 0   oxyCODONE (ROXICODONE) 15 MG immediate release tablet Take 1 tablet (15 mg total) by mouth every 6 (six) hours as needed for pain. (Patient taking differently: Take 15 mg by mouth every 6 (six) hours. ) 50 tablet 0   OxyCODONE HCl ER (OXYCONTIN) 30 MG T12A Take one tablet by mouth every 12 hours. Do not crush (Patient not taking: Reported on 01/13/2015) 60 each 0   risperidone (RISPERDAL) 4 MG tablet Take 8 mg by mouth at bedtime.     zolpidem (AMBIEN CR) 12.5 MG CR tablet Take 1 tablet (12.5 mg total) by mouth at bedtime. sleep (Patient not taking: Reported on 01/13/2015) 30 tablet 5   zolpidem (AMBIEN) 10 MG tablet Take 10 mg by mouth at bedtime.      Musculoskeletal: Strength & Muscle Tone: within normal limits Gait & Station: normal Patient leans: [name]     Psychiatric Specialty Exam:  Presentation  General Appearance:  Appropriate for Environment Eye Contact: Good Speech: Clear and Coherent; Normal Rate Speech Volume: Normal Handedness: Right  Mood and Affect  Mood: Anxious; Dysphoric Affect: Congruent  Thought Process  Thought Processes: Coherent; Goal Directed Descriptions of Associations:Intact Orientation:Full (Time, Place and Person) Thought  Content:WDL History of Schizophrenia/Schizoaffective disorder:No  Duration of Psychotic Symptoms:No data recorded Hallucinations:Hallucinations: None Ideas of Reference:None Suicidal Thoughts:Suicidal Thoughts: Yes, Active SI Active Intent and/or Plan: With Intent; With Plan; With Means to Carry Out (Plans to hang himself.) Homicidal Thoughts:Homicidal Thoughts: No  Sensorium  Memory: Immediate Good; Recent Good; Remote Good Judgment: Fair Insight: Fair  Art therapist  Concentration: Good Attention Span: Good Recall: Good Fund of Knowledge: Fair Language: Good  Psychomotor Activity  Psychomotor Activity: Psychomotor Activity: Normal  Assets  Assets: Communication Skills; Desire for Improvement; Financial Resources/Insurance; Housing; Social Support; Resilience  Sleep  Sleep: Sleep: Poor   Physical Exam: Physical Exam Vitals and nursing note reviewed. Exam conducted with a chaperone present.  Constitutional:      General: He is not in acute distress.    Appearance: Normal appearance. He is not ill-appearing.  Cardiovascular:     Rate and Rhythm: Normal rate.  Pulmonary:     Effort: Pulmonary effort is normal.  Neurological:     Mental Status: He is alert and oriented to person, place, and time.  Psychiatric:        Attention and Perception: Attention and perception normal. He does not perceive auditory or visual hallucinations.        Mood and Affect: Mood is anxious and depressed.        Speech: Speech normal.        Behavior: Behavior normal. Behavior is cooperative.        Thought Content: Thought content is not paranoid or delusional. Thought content includes suicidal ideation. Thought content does not include homicidal ideation. Thought content includes suicidal plan.        Cognition and Memory: Cognition and memory normal.        Judgment: Judgment is impulsive.   Review of Systems  Constitutional: Negative.   HENT: Negative.    Eyes:  Negative.   Respiratory: Negative.    Cardiovascular: Negative.   Gastrointestinal: Negative.   Genitourinary: Negative.   Musculoskeletal:  Positive for back pain, joint pain, myalgias and neck pain.  Skin: Negative.   Neurological: Negative.   Endo/Heme/Allergies: Negative.   Psychiatric/Behavioral:  Positive for depression, hallucinations (Denies) and suicidal ideas. Substance abuse: Denies.The patient is nervous/anxious and has insomnia.   Blood pressure 135/80, pulse 78, temperature 98.3 F (36.8 C), temperature source Oral, resp. rate 18, SpO2 97 %. There is no height or weight on file to calculate BMI.  Treatment Plan Summary: Plan inpatient psychiatric treatment   Medication management: Decrease Xanax to 0.5 mg Tid Continue Klonopin 1 mg Q hs Decreased Risperdal to 2 mg Q hs Continue Ambien 10 mg Q hs   Disposition: Recommend psychiatric Inpatient admission when medically cleared.  This service was provided via telemedicine using a 2-way, interactive audio and video technology.  Names of all persons participating in this telemedicine service and their role in this encounter. Name: Assunta Found, NP Role: PMHNP  Name: Dr. Nelly Rout Role: Psychiatrist  Name: Myles Rosenthal Role: Patient  Name: Daneen Schick, RN Role: Patient's nurse sent a secure message informing:  Psychiatric consult complete.  Patient continues to need inpatient psychiatric treatment.  Please inform MD only default listed    Kursten Kruk, NP 10/15/2020 2:30 PM

## 2020-10-16 ENCOUNTER — Telehealth (HOSPITAL_COMMUNITY): Payer: Self-pay | Admitting: Psychiatry

## 2020-10-16 NOTE — Telephone Encounter (Signed)
D:  Shuvon Rankin, NP referred pt to MH-IOP/PHP.  A:  Placed call to orient pt about MH-IOP/PHP but there was no answer.  Left vm for pt to call case manager back.  Inform Shuvon.

## 2020-10-16 NOTE — ED Notes (Addendum)
Patient states that he brought his medications to the emergency department. This Clinical research associate verified that he did NOT bring any medications by contacting his wife,with in patient pharmacy, inventory paperwork, and night shift nurse that was working 7/18 Mellody Dance, Charity fundraiser. They all state that he did not have his medications with him.

## 2020-10-16 NOTE — ED Notes (Signed)
Pt standing at door staring at this RN.

## 2020-10-16 NOTE — ED Notes (Signed)
Patient continues to pace in room, walks to and from the bathroom and is requesting something stronger to help him fall asleep. Pt stated "Those medications aren't working can you give me something else" this RN explained to pt that he was already administered the medications that were ordered. Pt stated "Well back when I was in [facility] they would give me Geodon shots can you ask the doctor to give me those shots" This RN let the pt know that I would address this to the doctor and for the pt to please return back to the room.

## 2020-10-16 NOTE — ED Provider Notes (Signed)
Emergency Medicine Observation Re-evaluation Note  [name] is a 55 y.o. male, seen on rounds today.  Pt initially presented to the ED for complaints of Suicidal Currently, the patient is medically cleared awaiting for behavioral health bed  Physical Exam  BP 117/88 (BP Location: Right Arm)   Pulse 77   Temp 97.7 F (36.5 C) (Oral)   Resp 18   SpO2 100%  Physical Exam Patient alert and awake still depressed and suicidal  ED Course / MDM  EKG:   I have reviewed the labs performed to date as well as medications administered while in observation.  Recent changes in the last 24 hours include none  Plan  Current plan is for behavioral health bed Patient is under full IVC at this time.   Bethann Berkshire, MD 10/16/20 1020

## 2020-10-16 NOTE — ED Notes (Signed)
Pt getting agitated. Pt states "I cant take it any more I need something else to help me sleep"

## 2020-10-16 NOTE — ED Notes (Signed)
Pt in bathroom

## 2020-10-16 NOTE — ED Notes (Signed)
Pt demanding to speak with doctor.

## 2020-10-16 NOTE — ED Notes (Signed)
Patient is very anxious and will not sit still or fall asleep. I reassured him that he can fall asleep, he is in a safe caring environment, all of his belongs will be given back when discharged, and that he should try and get some sleep this morning. He said, "I haven't slept in the last 3 days." His lights have been turned off and is currently attempting to sleep.

## 2020-10-16 NOTE — ED Notes (Signed)
All belongings that [name] brought were given back to him at discharge.

## 2020-10-16 NOTE — Discharge Instructions (Addendum)
Follow-up as instructed by behavioral health 

## 2020-10-16 NOTE — ED Notes (Signed)
C/o back pain. Pain meds were given

## 2020-10-16 NOTE — Progress Notes (Signed)
Patient has been faxed out due to no bed availability at [facility]. Patient meets inpatient criteria per Melbourne Abts, PA-C. Patient referred to the following facilities:  [facility]  [address] [place] [address] [phone]  [facility] [address]  [address] [place] [address] [phone]  [facility]  [address] [place] [address] [phone]  [facility] [place] [facility]  [address] [place], [place] [address] [phone]  [facility]  [address] [place] [address] [phone]  [facility]  [address] [place], [place] [address] [phone]  [facility]  [address], [place] [address] [phone]  [facility]  [address] [place] [address] [phone]  [facility]  [address], [place] [address] [phone]  [facility]  [address] [place] [address] [phone]  [facility]  [address] [place] [address] [phone]  [facility]  [address], [place] [address] [phone]  [facility]  [address] [place] [address] [phone]  [facility]  [address] [place] [address] [phone]  [facility]  [address] [place], [place] [address] [phone]  [facility]  [address] [place] [address] [phone]  [facility]  [address] [place] [address] [phone]     CSW will continue to monitor disposition.    Damita Dunnings, MSW, LCSW-A  11:33 AM 10/16/2020

## 2020-10-16 NOTE — BH Assessment (Signed)
Comprehensive Clinical Assessment (CCA) Screening, Triage and Referral Note  10/16/2020 [name] [id number]  Disposition: Per Assunta Found, NP, patient is psychiatrically cleared. Patient referred to IOP/PHP program.   The patient demonstrates the following risk factors for suicide: Chronic risk factors for suicide include: psychiatric disorder of MDD, GAD and chronic pain. Acute risk factors for suicide include: [name]. Protective factors for this patient include: positive social support, positive therapeutic relationship, responsibility to others (children, family), and hope for the future. Considering these factors, the overall suicide risk at this point appears to be moderate. Patient is appropriate for outpatient follow up.  [name] is reassessed on today to see if he continues to meet criteria for inpatient treatment. Patient reports that he feels "tired" today and reports that he has been having a difficult time sleeping for the past couple of days. Patient denies feeling depressed, sad or down and states "I'm just tired".  Patient also reports that he is COVID+ however denies having any related symptoms other than being tired. Patient reports that NP provided him with medications to assist him with sleeping but it has not helped him much. Patient also reports needing medications for pain. Patient reports that he lives at home with his wife and cat. Patient states that he has been married for 35 years. Patient reports daily activity of doing different things such as fishing, hunting and work around the house. Patient reports he presented to ED about two days ago due to "feeling run down on myself, and I didn't do my medications right. I fell on the wayside and needed to get back on track". Patient denies overtaking medications to help him sleep and reports compliance with his medications. Patient is denying SI currently and contracts for safety. Patient states "I ain't through living yet and  I have a whole lot to live for" and reports protective factors of his wife and family. Patient denies any prior suicide attempts. Patient denies HI, and AVH. Patient states I would like to sleep before going home but feels that he is ok with discharging and following up with outpatient providers to address sleep issues. Patient consents for TTS to talk with his wife [name]. TTS informed wife that patient denied having any SI and was ready to come home. Wife denied having any safety concerns or issues with patient returning home. TTS encouraged wife to be supportive when patient returns home and to monitor him for safety issues and if they noticed things getting worse or having any safety concerns to have patient return to ED or [facility] for further evaluation.     Patient is alert, engaged and oriented to person, place and situation. Patient is cooperative during assessment; his eye contact and tone of voice is normal. Patient presents in a good mood and reports feeling 'tired". Patient does not appear to be responding to internal/external stimuli and his thoughts are liner and without delusional content. Patient denies SI, HI, AVH and substance use. When asked reason for change in SI patient states that when he is alone and had issues with sleeping, he "ponders" and started having SI that he would never act on and when he got around doctors he started to feel a lot better.   Chief Complaint:  Chief Complaint  Patient presents with   Suicidal   Visit Diagnosis: MDD (major depressive disorder), recurrent severe, without psychosis (HCC)  Patient Reported Information How did you hear about [place]? Self  What Is the Reason for Your Visit/Call  Today? Depression, SI and anxiety  How Long Has This Been Causing You Problems? 1 wk - 1 month  What Do You Feel Would Help You the Most Today? Treatment for Depression or other mood problem   Have You Recently Had Any Thoughts About Hurting Yourself? Yes  Are  You Planning to Commit Suicide/Harm Yourself At This time? No   Have you Recently Had Thoughts About Hurting Someone [name]? No  Are You Planning to Harm Someone at This Time? No  Explanation: No data recorded  Have You Used Any Alcohol or Drugs in the Past 24 Hours? No  How Long Ago Did You Use Drugs or Alcohol? No data recorded What Did You Use and How Much? No data recorded  Do You Currently Have a Therapist/Psychiatrist? No  Name of Therapist/Psychiatrist: No data recorded  Have You Been Recently Discharged From Any Office Practice or Programs? No  Explanation of Discharge From Practice/Program: No data recorded   CCA Screening Triage Referral Assessment Type of Contact: Tele-Assessment  Telemedicine Service Delivery: Telemedicine service delivery: This service was provided via telemedicine using a 2-way, interactive audio and video technology  Is this Initial or Reassessment? Reassessment  Date Telepsych consult ordered in [facility]:  10/16/20  Time Telepsych consult ordered in [facility]:  1920  Location of Assessment: [facility] ED  Provider Location: [facility] Assessment Services   Collateral Involvement: Payton Doughty   Does Patient Have a Automotive engineer Guardian? No data recorded Name and Contact of Legal Guardian: No data recorded If Minor and Not Living with Parent(s), Who has Custody? NA  Is CPS involved or ever been involved? Never  Is APS involved or ever been involved? Never   Patient Determined To Be At Risk for Harm To Self or Others Based on Review of Patient Reported Information or Presenting Complaint? No  Method: No data recorded Availability of Means: No data recorded Intent: No data recorded Notification Required: No data recorded Additional Information for Danger to Others Potential: No data recorded Additional Comments for Danger to Others Potential: No data recorded Are There Guns or Other Weapons in Your Home? No data recorded Types of Guns/Weapons:  No data recorded Are These Weapons Safely Secured?                            No data recorded Who Could Verify You Are Able To Have These Secured: No data recorded Do You Have any Outstanding Charges, Pending Court Dates, Parole/Probation? No data recorded Contacted To Inform of Risk of Harm To Self or Others: Family/Significant Other:   Does Patient Present under Involuntary Commitment? No  IVC Papers Initial File Date: No data recorded  [place] of Residence: [place]   Patient Currently Receiving the Following Services: Medication Management   Determination of Need: Urgent (48 hours)   Options For Referral: Outpatient Therapy; Medication Management   Discharge Disposition:     Audree Camel, [facility]

## 2021-01-21 ENCOUNTER — Emergency Department (HOSPITAL_COMMUNITY)
Admission: EM | Admit: 2021-01-21 | Discharge: 2021-01-22 | Disposition: A | Discharge status: Home / Self Care | Payer: Medicare Other | Attending: Emergency Medicine | Admitting: Emergency Medicine

## 2021-01-21 ENCOUNTER — Encounter (HOSPITAL_COMMUNITY): Payer: Self-pay | Admitting: Emergency Medicine

## 2021-01-21 ENCOUNTER — Other Ambulatory Visit: Payer: Self-pay

## 2021-01-21 DIAGNOSIS — G4700 Insomnia, unspecified: Secondary | ICD-10-CM

## 2021-01-21 DIAGNOSIS — F419 Anxiety disorder, unspecified: Secondary | ICD-10-CM | POA: Diagnosis present

## 2021-01-21 DIAGNOSIS — Z20822 Contact with and (suspected) exposure to covid-19: Secondary | ICD-10-CM | POA: Insufficient documentation

## 2021-01-21 DIAGNOSIS — R4182 Altered mental status, unspecified: Secondary | ICD-10-CM | POA: Diagnosis not present

## 2021-01-21 DIAGNOSIS — F332 Major depressive disorder, recurrent severe without psychotic features: Secondary | ICD-10-CM | POA: Diagnosis present

## 2021-01-21 DIAGNOSIS — Z046 Encounter for general psychiatric examination, requested by authority: Secondary | ICD-10-CM

## 2021-01-21 LAB — COMPREHENSIVE METABOLIC PANEL
ALT: 21 U/L (ref 0–44)
AST: 19 U/L (ref 15–41)
Albumin: 3.8 g/dL (ref 3.5–5.0)
Alkaline Phosphatase: 104 U/L (ref 38–126)
Anion gap: 13 (ref 5–15)
BUN: 13 mg/dL (ref 6–20)
CO2: 25 mmol/L (ref 22–32)
Calcium: 9.3 mg/dL (ref 8.9–10.3)
Chloride: 95 mmol/L — ABNORMAL LOW (ref 98–111)
Creatinine, Ser: 0.76 mg/dL (ref 0.61–1.24)
GFR, Estimated: >60 mL/min (ref >60)
Glucose, Bld: 116 mg/dL — ABNORMAL HIGH (ref 70–99)
Potassium: 3.6 mmol/L (ref 3.5–5.1)
Sodium: 133 mmol/L — ABNORMAL LOW (ref 135–145)
Total Bilirubin: 0.4 mg/dL (ref 0.3–1.2)
Total Protein: 8.5 g/dL — ABNORMAL HIGH (ref 6.5–8.1)

## 2021-01-21 LAB — CBC WITH DIFFERENTIAL/PLATELET
Abs Immature Granulocytes: 0.03 10*3/uL (ref 0.00–0.07)
Basophils Absolute: 0 10*3/uL (ref 0.0–0.1)
Basophils Relative: 0 %
Eosinophils Absolute: 0.1 10*3/uL (ref 0.0–0.5)
Eosinophils Relative: 1 %
HCT: 44.7 % (ref 39.0–52.0)
Hemoglobin: 14.4 g/dL (ref 13.0–17.0)
Immature Granulocytes: 1 %
Lymphocytes Relative: 19 %
Lymphs Abs: 1.2 10*3/uL (ref 0.7–4.0)
MCH: 28.4 pg (ref 26.0–34.0)
MCHC: 32.2 g/dL (ref 30.0–36.0)
MCV: 88.2 fL (ref 80.0–100.0)
Monocytes Absolute: 0.4 10*3/uL (ref 0.1–1.0)
Monocytes Relative: 6 %
Neutro Abs: 4.5 10*3/uL (ref 1.7–7.7)
Neutrophils Relative %: 73 %
Platelets: 321 10*3/uL (ref 150–400)
RBC: 5.07 MIL/uL (ref 4.22–5.81)
RDW: 13.5 % (ref 11.5–15.5)
WBC: 6.1 10*3/uL (ref 4.0–10.5)
nRBC: 0 % (ref 0.0–0.2)

## 2021-01-21 LAB — ACETAMINOPHEN LEVEL: Acetaminophen (Tylenol), Serum: <10 ug/mL — ABNORMAL LOW (ref 10–30)

## 2021-01-21 LAB — RESP PANEL BY RT-PCR (FLU A&B, COVID) ARPGX2
Influenza A by PCR: NEGATIVE
Influenza B by PCR: NEGATIVE
SARS Coronavirus 2 by RT PCR: NEGATIVE

## 2021-01-21 LAB — TROPONIN I (HIGH SENSITIVITY)
Troponin I (High Sensitivity): <2 ng/L (ref <18)
Troponin I (High Sensitivity): 4 ng/L (ref <18)

## 2021-01-21 LAB — SALICYLATE LEVEL: Salicylate Lvl: <7 mg/dL — ABNORMAL LOW (ref 7.0–30.0)

## 2021-01-21 LAB — ETHANOL: Alcohol, Ethyl (B): <10 mg/dL (ref <10)

## 2021-01-21 MED ORDER — CLONAZEPAM 0.5 MG PO TABS
1.0000 mg | ORAL_TABLET | Freq: Three times a day (TID) | ORAL | Status: DC
  Administered 2021-01-21 – 2021-01-22 (×2): 1 mg via ORAL
  Filled 2021-01-21 (×2): qty 2

## 2021-01-21 MED ORDER — AMITRIPTYLINE HCL 25 MG PO TABS
75.0000 mg | ORAL_TABLET | Freq: Three times a day (TID) | ORAL | Status: DC
  Administered 2021-01-21 – 2021-01-22 (×2): 75 mg via ORAL
  Filled 2021-01-21: qty 3
  Filled 2021-01-21: qty 1
  Filled 2021-01-21: qty 3

## 2021-01-21 MED ORDER — OXYCODONE HCL 5 MG PO TABS
10.0000 mg | ORAL_TABLET | Freq: Four times a day (QID) | ORAL | Status: DC | PRN
Start: 2021-01-21 — End: 2021-01-22
  Administered 2021-01-21 – 2021-01-22 (×2): 10 mg via ORAL
  Filled 2021-01-21 (×2): qty 2

## 2021-01-21 NOTE — ED Provider Notes (Signed)
Emergency Medicine Provider Triage Evaluation Note  [name] , a 55 y.o. male  was evaluated in triage.  Pt complains of Insomnia x4 days, and "being scared".  Patient with history as reported by the patient, and feels that his wife is not safe around him that he cannot specify why.  Denies any SI or HI.  He is clearly anxious, circular in his speech..  Review of Systems  Positive: Anxiety, insomnia, whole body aches Negative: Palpitations, shortness of breath  Physical Exam  BP (!) 155/103 (BP Location: Left Arm)   Pulse (!) 115   Temp 98.5 F (36.9 C)   Resp 18   SpO2 96%  Gen:   Awake, no distress, anxious Resp:  Normal effort  MSK:   Moves extremities without difficulty  Other:  Abdomen soft nondistended, tender, nontender EXTR.  No TTP.  Patient appears responding to internal stimuli.  Speech is pressured and tangential.  Medical Decision Making  Medically screening exam initiated at 2:17 PM.  Appropriate orders placed.  Sael Furches was informed that the remainder of the evaluation will be completed by another provider, this initial triage assessment does not replace that evaluation, and the importance of remaining in the ED until their evaluation is complete.  Will proceed with medical clearance laboratory studies.  No IVC placed at this time as patient remains voluntary in the emergency department .  This chart was dictated using voice recognition software, Dragon. Despite the best efforts of this provider to proofread and correct errors, errors may still occur which can change documentation meaning.    Paris Lore, PA-C 01/21/21 1419    Mancel Bale, MD 01/21/21 Flossie Buffy

## 2021-01-21 NOTE — ED Notes (Signed)
Pt changed into scrubs, belonging bag placed in locker 2

## 2021-01-21 NOTE — ED Triage Notes (Signed)
Pt states he has not had any sleep in 4 days. He is unsure of when he last took his medications. Denies SI/HI. Endorses body pain all over, including chest and neck.

## 2021-01-21 NOTE — ED Provider Notes (Signed)
[facility] EMERGENCY DEPARTMENT Provider Note   CSN: [id number] Arrival date & time: 01/21/21  1259     History Chief Complaint  Patient presents with   Insomnia   Altered Mental Status    [name] is a 55 y.o. male.  The history is provided by the patient and medical records.  Insomnia This is a new problem. The current episode started more than 2 days ago. The problem occurs constantly. The problem has not changed since onset.Pertinent negatives include no chest pain, no abdominal pain, no headaches and no shortness of breath. Nothing aggravates the symptoms. Nothing relieves the symptoms. He has tried nothing for the symptoms. The treatment provided no relief.  Altered Mental Status Presenting symptoms: no confusion   Associated symptoms: no abdominal pain, no agitation, no fever, no headaches, no light-headedness, no nausea and no vomiting       Past Medical History:  Diagnosis Date   Anxiety    Arthritis    avascular necrosis bilateral hips - right hip pain usally hurts worse.   neck pain-has bulging disks in his neck -   Carotid artery stenosis    "MILD, LESS THAN OR EQUAL TO 39%, LEFT AND RIGHT INTERNAL CAROTID ARTERY STENOSIS" - PER ULTRASOUND REPORT 09/28/12 [facility].   Chronic abdominal pain    Depression    GERD (gastroesophageal reflux disease)    Insomnia    Neck pain    pt states he has 3 bulging disks -no surgery   PTSD (post-traumatic stress disorder)    Sleep apnea    has cpap - but does not use-does not know setting-doesnot tolerate mask   Smoking    Tachycardia    pt states his heart rate may be elevated because of anxiety over planned surgery    Patient Active Problem List   Diagnosis Date Noted   MDD (major depressive disorder), recurrent severe, without psychosis (HCC) 10/15/2020   Suicidal ideation    Tobacco use disorder 04/21/2013   Left hip pain 03/08/2013   Avascular necrosis of bone of left hip (HCC)  02/25/2013   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   Acute posthemorrhagic anemia 12/27/2012   Hip pain, acute 12/27/2012   GERD (gastroesophageal reflux disease) 11/24/2012   Insomnia 11/24/2012   Carotid artery stenosis 11/24/2012   Avascular necrosis of bone of right hip (HCC) 11/19/2012   Abdominal pain 12/16/2011   Chronic pancreatitis (HCC) 12/16/2011   Anxiety 12/16/2011   Depression 12/16/2011    Past Surgical History:  Procedure Laterality Date   CHOLECYSTECTOMY     COLONOSCOPY     TOTAL HIP ARTHROPLASTY Right 11/19/2012   Procedure: RIGHT TOTAL HIP ARTHROPLASTY ANTERIOR APPROACH;  Surgeon: Kathryne Hitch, MD;  Location: [facility] ORS;  Service: Orthopedics;  Laterality: Right;   TOTAL HIP ARTHROPLASTY Left 02/25/2013   Procedure: LEFT TOTAL HIP ARTHROPLASTY ANTERIOR APPROACH;  Surgeon: Kathryne Hitch, MD;  Location: [facility] ORS;  Service: Orthopedics;  Laterality: Left;       Family History  Problem Relation Age of Onset   Diabetes Mother     Social History   Tobacco Use   Smoking status: Former    Packs/day: 1.00    Years: 31.00    Pack years: 31.00    Types: Cigarettes   Smokeless tobacco: Former  Building services engineer Use: Never used  Substance Use Topics   Alcohol use: Not Currently    Comment: once a week -  a beer---states he no longer uses marijuana and denies use of any other recreational drugs/ office notes obtained from [facility] - last drug screen 10/22/12 positive for marijuana   Drug use: No    Comment: previous history of THC use    Home Medications Prior to Admission medications   Medication Sig Start Date End Date Taking? Authorizing Provider  albuterol (PROVENTIL HFA;VENTOLIN HFA) 108 (90 BASE) MCG/ACT inhaler Inhale 1-2 puffs into the lungs every 6 (six) hours as needed for wheezing or shortness of breath. Patient not taking: Reported on 10/15/2020 01/13/15   Hedges, Tinnie Gens, PA-C  ALPRAZolam Prudy Feeler) 1 MG tablet  Take 1 tablet (1 mg total) by mouth 3 (three) times daily as needed for anxiety. Patient not taking: Reported on 10/15/2020 03/02/13   Sharon Seller, NP  amitriptyline (ELAVIL) 75 MG tablet Take 75 mg by mouth 3 (three) times daily.    [provider]  aspirin EC 325 MG EC tablet Take 1 tablet (325 mg total) by mouth 2 (two) times daily after a meal. Patient not taking: Reported on 10/15/2020 02/28/13   Kirtland Bouchard, PA-C  clonazePAM (KLONOPIN) 1 MG tablet Take 1 mg by mouth 3 (three) times daily.    [provider]  dicyclomine (BENTYL) 20 MG tablet Take 20 mg by mouth every 6 (six) hours as needed for spasms.  Patient not taking: Reported on 10/15/2020    [provider]  ELIQUIS 5 MG TABS tablet Take 5 mg by mouth 2 (two) times daily. Patient not taking: Reported on 10/15/2020 01/04/15   [provider]  esomeprazole (NEXIUM) 20 MG capsule Take 40 mg by mouth daily at 12 noon. Patient not taking: Reported on 10/15/2020    [provider]  Eszopiclone 3 MG TABS Take 3 mg by mouth at bedtime. Take immediately before bedtime    [provider]  gabapentin (NEURONTIN) 300 MG capsule Take 1 capsule (300 mg total) by mouth at bedtime. Patient not taking: Reported on 10/15/2020 03/01/13   Kathryne Hitch, MD  methocarbamol (ROBAXIN) 500 MG tablet Take 1 tablet (500 mg total) by mouth every 6 (six) hours as needed for muscle spasms. Patient not taking: Reported on 10/15/2020 02/28/13   Kirtland Bouchard, PA-C  methocarbamol (ROBAXIN) 750 MG tablet Take 750 mg by mouth 4 (four) times daily as needed for muscle spasms.  Patient not taking: Reported on 10/15/2020    [provider]  Oxycodone HCl 10 MG TABS Take 10 mg by mouth 4 (four) times daily.    [provider]  risperidone (RISPERDAL) 4 MG tablet Take 8 mg by mouth at bedtime. Patient not taking: No sig reported    [provider]  Vitamin D, Ergocalciferol,  (DRISDOL) 1.25 MG (50000 UNIT) CAPS capsule Take 50,000 Units by mouth every Monday.    [provider]  zolpidem (AMBIEN CR) 12.5 MG CR tablet Take 1 tablet (12.5 mg total) by mouth at bedtime. sleep Patient not taking: Reported on 10/15/2020 11/22/12   Sharon Seller, NP  zolpidem (AMBIEN) 10 MG tablet Take 10 mg by mouth at bedtime. Patient not taking: Reported on 10/15/2020    [provider]    Allergies    Antihistamines, chlorpheniramine-type; Levofloxacin; Thorazine [chlorpromazine]; and Prednisone  Review of Systems   Review of Systems  Constitutional:  Negative for chills, fatigue and fever.  HENT:  Negative for congestion.   Eyes:  Negative for visual disturbance.  Respiratory:  Negative for cough, chest tightness, shortness of breath and wheezing.   Cardiovascular:  Negative for chest pain.  Gastrointestinal:  Negative for abdominal pain, constipation, diarrhea, nausea and vomiting.  Genitourinary:  Negative for flank pain.  Musculoskeletal:  Negative for back pain.  Skin:  Negative for wound.  Neurological:  Negative for dizziness, light-headedness and headaches.  Psychiatric/Behavioral:  Positive for sleep disturbance. Negative for agitation, confusion and suicidal ideas. The patient is nervous/anxious, has insomnia and is hyperactive.   All other systems reviewed and are negative.  Physical Exam Updated Vital Signs BP (!) 155/103 (BP Location: Left Arm)   Pulse (!) 115   Temp 98.5 F (36.9 C)   Resp 18   SpO2 96%   Physical Exam Vitals and nursing note reviewed.  Constitutional:      General: He is not in acute distress.    Appearance: He is well-developed. He is not ill-appearing, toxic-appearing or diaphoretic.  HENT:     Head: Normocephalic and atraumatic.     Nose: Nose normal.     Mouth/Throat:     Mouth: Mucous membranes are moist.  Eyes:     Conjunctiva/sclera: Conjunctivae normal.  Cardiovascular:     Rate and Rhythm: Regular  rhythm. Tachycardia present.     Heart sounds: No murmur heard. Pulmonary:     Effort: Pulmonary effort is normal. No respiratory distress.     Breath sounds: Normal breath sounds. No wheezing, rhonchi or rales.  Chest:     Chest wall: No tenderness.  Abdominal:     General: Abdomen is flat.     Palpations: Abdomen is soft.     Tenderness: There is no abdominal tenderness. There is no guarding or rebound.  Musculoskeletal:        General: No tenderness.     Cervical back: Neck supple.  Skin:    General: Skin is warm and dry.     Capillary Refill: Capillary refill takes less than 2 seconds.     Findings: No erythema.  Neurological:     General: No focal deficit present.     Mental Status: He is alert. Mental status is at baseline.  Psychiatric:        Attention and Perception: Perception normal.        Mood and Affect: Mood is anxious.        Behavior: Behavior is not agitated or aggressive.        Thought Content: Thought content does not include homicidal or suicidal ideation. Thought content does not include homicidal or suicidal plan.    ED Results / Procedures / Treatments   Labs (all labs ordered are listed, but only abnormal results are displayed) Labs Reviewed  COMPREHENSIVE METABOLIC PANEL - Abnormal; Notable for the following components:      Result Value   Sodium 133 (*)    Chloride 95 (*)    Glucose, Bld 116 (*)    Total Protein 8.5 (*)    All other components within normal limits  SALICYLATE LEVEL - Abnormal; Notable for the following components:   Salicylate Lvl <7.0 (*)    All other components within normal limits  ACETAMINOPHEN LEVEL - Abnormal; Notable for the following components:   Acetaminophen (Tylenol), Serum <10 (*)    All other components within normal limits  RESP PANEL BY RT-PCR (FLU A&B, COVID) ARPGX2  ETHANOL  CBC WITH DIFFERENTIAL/PLATELET  RAPID URINE DRUG SCREEN, HOSP PERFORMED  TROPONIN I (HIGH SENSITIVITY)  TROPONIN I (HIGH  SENSITIVITY)  EKG EKG Interpretation  Date/Time:  Monday January 21 2021 13:56:09 EDT Ventricular Rate:  114 PR Interval:  170 QRS Duration: 116 QT Interval:  332 QTC Calculation: 457 R Axis:   -37 Text Interpretation: Sinus tachycardia Left axis deviation Low voltage QRS Right bundle branch block Abnormal ECG When compared to prior, faster rate and new RBBB. No STEMI Confirmed by Theda Belfast ([id number]) on 01/21/2021 6:47:55 PM  Radiology No results found.  Procedures Procedures   Medications Ordered in ED Medications  amitriptyline (ELAVIL) tablet 75 mg (75 mg Oral Given 01/21/21 2307)  clonazePAM (KLONOPIN) tablet 1 mg (1 mg Oral Given 01/21/21 2213)  oxyCODONE (Oxy IR/ROXICODONE) immediate release tablet 10 mg (10 mg Oral Given 01/21/21 2214)    ED Course  I have reviewed the triage vital signs and the nursing notes.  Pertinent labs & imaging results that were available during my care of the patient were reviewed by me and considered in my medical decision making (see chart for details).    MDM Rules/Calculators/A&P                           Olen Eaves is a 55 y.o. male with a past medical history significant for chronic pains, major depression, and anxiety who presents for mind racing, no sleep for the last few days, anxiousness.  According to patient, he has not had any of his medicine for the last 4 days.  He says that he has not been sleeping his mind is racing and he is concerned about his family at home.  He reports no new physical concerns but reports he always has pain all across his body.  He reports that outpatient pain management has been overall unsuccessful for him.  He denies any new fevers, chills, congestion, or cough.  Denies any nausea, vomiting, constipation, diarrhea, or urinary changes.  He is primary concerned about his mental state now that he is not on any medications.  Patient arrives under IVC for concern for mania.  On my exam, patient  does report racing thoughts.  He reports he is not sleeping at all and has not been eating or drinking as much either.  He reports only thing is been eating some ice cream.  He had clear breath sounds and nontender chest or abdomen.  Back was nontender on my exam and lungs were clear.  Patient moving all extremities.  For me, patient denied SI or HI and also denies hallucinations but he does report that his thoughts are very concerning.  He would not describe exactly but said it was "disturbing".  He says that he has been keeping the TV on to try to drown out his thoughts.  As patient is not eating or drinking and is not sleeping or taking medication in the setting of concerning thoughts, agree with IVC from previous team.  Anticipate follow-up on psychiatric recommendations.   9:34 PM Medical work-up overall reassuring.  He is medically clear for psychiatric management.  TTS consult placed.  His med rec was completed and some of his home medications were ordered.  Awaiting TTS recommendations.   Final Clinical Impression(s) / ED Diagnoses Final diagnoses:  Insomnia, unspecified type  Involuntary commitment    Clinical Impression: 1. Insomnia, unspecified type   2. Involuntary commitment     Disposition: Awaiting psychiatric recommendations.  This note was prepared with assistance of Conservation officer, historic buildings. Occasional wrong-word or sound-a-like substitutions may have occurred due  to the inherent limitations of voice recognition software.     Shonika Kolasinski, Canary Brim, MD 01/22/21 [phone]

## 2021-01-22 LAB — RAPID URINE DRUG SCREEN, HOSP PERFORMED
Amphetamines: NOT DETECTED
Barbiturates: NOT DETECTED
Benzodiazepines: NOT DETECTED
Cocaine: NOT DETECTED
Opiates: NOT DETECTED
Tetrahydrocannabinol: NOT DETECTED

## 2021-01-22 NOTE — BH Assessment (Signed)
[facility] Assessment Progress Note   Per Ophelia Shoulder, NP, this pt does not require psychiatric hospitalization at this time.  Pt is psychiatrically cleared.  Discharge instructions include referrals for several behavioral health providers near [place], pt's community of residence.  Pt's nurse, Irving Burton, has been notified.  Doylene Canning, MA Triage Specialist [phone]

## 2021-01-22 NOTE — Discharge Instructions (Signed)
For your behavioral health needs you are advised to follow up with an outpatient provider.  Contact one of the following providers at your earliest opportunity to schedule an intake appointment:       [facility]      [address] [place], [place] [address]      [phone]       [facility]      [address]      [place], [place] [address]      [phone]       [facility]      [address]      [place], [place] [address]      [phone]

## 2021-01-22 NOTE — Consult Note (Signed)
Telepsych Consultation   Reason for Consult:  Psychiatric Reassessment Referring Physician:  Haze Rushing, PA-C Location of Patient:    [name] Location of Provider: Other: virtual home office  Patient Identification: [name] MRN:  [id number] Principal Diagnosis: Insomnia Diagnosis:  Principal Problem:   Insomnia Active Problems:   Anxiety   MDD (major depressive disorder), recurrent severe, without psychosis (HCC)   Total Time spent with patient: 30 minutes  Subjective:   [name] is a 55 y.o. male patient admitted for overnight observation and AM psychiatric reassessment for sleep concerns x 3-4 days. Patient states today, "I've had better but I don't want to hurt anyone."   Patient seen via telepsych by this provider; chart reviewed and consulted with Dr. Bronwen Betters on 01/22/21.  On evaluation [name] is clear , coherent and able to articulate his concerns appropriately.  He does not appear manic.  He reports he came to the emergency department because he had problems sleeping and had prematurely used all of his prescribed clonazepam.    He has a history for pain due to chronic neck, back, shoulder and hip concerns.  He tells me he is followed by Dr, Verdis Frederickson Arwin [address], [place], [place] for this.  Last visit was about one week ago-patient takes eszopiclone 3mg  po qhs; clonazepam 1mg  po TID and suboxone 8-2mg  film was added with the goal of optimization. At baseline, he reports sleep disturbance, gets a few hours at night, but as of 3-4 days ago had problems with sleep secondary to pain.  Since admission, states he's rested on and off for a few hours still reports being tired but his mentation has cleared up.   He is alert and oriented x3; speaks in monotone voice but otherwise clear and coherent.  Denies suicidal or homicidal ideations, states he feels safe to return home but  mostly concerned about getting renewal on his clonazepam.  Last prescribed dose  was 01/14/2021 #90 of which patient states he completed "a few days ago"  because he became anxious when he could not sleep. Was taking -5 mg daily instead of 3mg  daily.  States he self medicates because he did not want to burden his spouse with this responsibility.  In lieu of what appears to be recent misuse, recommend he allow his wife to manage medications for him.    Admission labs reviewed, UDS, Covid are negative.  BG elevated at 116mg /dl but does not appear he was fasting.  TSH not completed.  Qt-QTC interval 332-457, RBBB noted. Patient has history for carotid artery stenosis that dated back to 2014. No significant abnormalities with CMP or CBC, patient was evaluated by EDP and medically cleared.   HPI:  Per EDP Admission Assessment 01/21/2021: Chief Complaint  Patient presents with   Insomnia   Altered Mental Status      [name] is a 55 y.o. male.   The history is provided by the patient and medical records.  Insomnia This is a new problem. The current episode started more than 2 days ago. The problem occurs constantly. The problem has not changed since onset.Pertinent negatives include no chest pain, no abdominal pain, no headaches and no shortness of breath. Nothing aggravates the symptoms. Nothing relieves the symptoms. He has tried nothing for the symptoms. The treatment provided no relief.  Altered Mental Status Presenting symptoms: no confusion   Associated symptoms: no abdominal pain, no agitation, no fever, no headaches, no light-headedness, no nausea and no vomiting  Past Psychiatric History: anxiety, depression, PTSD  Risk to Self:  no Risk to Others:  no Prior Inpatient Therapy:  yes Prior Outpatient Therapy:  yes  Past Medical History:  Past Medical History:  Diagnosis Date   Anxiety    Arthritis    avascular necrosis bilateral hips - right hip pain usally hurts worse.   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   Chronic abdominal pain    Depression    GERD (gastroesophageal reflux disease)    Insomnia    Neck pain    pt states he has 3 bulging disks -no surgery   PTSD (post-traumatic stress disorder)    Sleep apnea    has cpap - but does not use-does not know setting-doesnot tolerate mask   Smoking    Tachycardia    pt states his heart rate may be elevated because of anxiety over planned surgery    Past Surgical History:  Procedure Laterality Date   CHOLECYSTECTOMY     COLONOSCOPY     TOTAL HIP ARTHROPLASTY Right 11/19/2012   Procedure: RIGHT TOTAL HIP ARTHROPLASTY ANTERIOR APPROACH;  Surgeon: Kathryne Hitch, MD;  Location: [facility];  Service: Orthopedics;  Laterality: Right;   TOTAL HIP ARTHROPLASTY Left 02/25/2013   Procedure: LEFT TOTAL HIP ARTHROPLASTY ANTERIOR APPROACH;  Surgeon: Kathryne Hitch, MD;  Location: [facility];  Service: Orthopedics;  Laterality: Left;   Family History:  Family History  Problem Relation Age of Onset   Diabetes Mother    Family Psychiatric  History: unknown Social History:  Social History   Substance and Sexual Activity  Alcohol Use Not Currently   Comment: once a week - a beer---states he no longer uses marijuana and denies use of any other recreational drugs/ office notes obtained from [facility] - last drug screen 10/22/12 positive for marijuana     Social History   Substance and Sexual Activity  Drug Use No   Comment: previous history of THC use    Social History   Socioeconomic History   Marital status: Married    Spouse name: Not on file   Number of children: Not on file   Years of education: Not on file   Highest education level: Not on file  Occupational History   Not on file  Tobacco Use   Smoking status: Former    Packs/day: 1.00    Years: 31.00    Pack years: 31.00    Types:  Cigarettes   Smokeless tobacco: Former  Building services engineer Use: Never used  Substance and Sexual Activity   Alcohol use: Not Currently    Comment: once a week - a beer---states he no longer uses marijuana and denies use of any other recreational drugs/ office notes obtained from [facility] - last drug screen 10/22/12 positive for marijuana   Drug use: No    Comment: previous history of THC use   Sexual activity: Yes  Other Topics Concern   Not on file  Social History Narrative   Not on file   Social Determinants of Health   Financial Resource Strain: Not on file  Food Insecurity: Not on file  Transportation Needs: Not on file  Physical Activity: Not on file  Stress: Not on file  Social Connections: Not on file  Additional Social History:    Allergies:   Allergies  Allergen Reactions   Antihistamines, Chlorpheniramine-Type Other (See Comments)    All antihistamines-Behavior issues   Levofloxacin Other (See Comments)    Disorientation    Thorazine [Chlorpromazine] Other (See Comments)    Agitation    Prednisone Anxiety    Labs:  Results for orders placed or performed during the hospital encounter of 01/21/21 (from the past 48 hour(s))  Comprehensive metabolic panel     Status: Abnormal   Collection Time: 01/21/21  2:17 PM  Result Value Ref Range   Sodium 133 (L) 135 - 145 mmol/L   Potassium 3.6 3.5 - 5.1 mmol/L   Chloride 95 (L) 98 - 111 mmol/L   CO2 25 22 - 32 mmol/L   Glucose, Bld 116 (H) 70 - 99 mg/dL    Comment: Glucose reference range applies only to samples taken after fasting for at least 8 hours.   BUN 13 6 - 20 mg/dL   Creatinine, Ser 1.61 0.61 - 1.24 mg/dL   Calcium 9.3 8.9 - 09.6 mg/dL   Total Protein 8.5 (H) 6.5 - 8.1 g/dL   Albumin 3.8 3.5 - 5.0 g/dL   AST 19 15 - 41 U/L   ALT 21 0 - 44 U/L   Alkaline Phosphatase 104 38 - 126 U/L   Total Bilirubin 0.4 0.3 - 1.2 mg/dL   GFR, Estimated >04 >54 mL/min    Comment: (NOTE) Calculated  using the CKD-EPI Creatinine Equation (2021)    Anion gap 13 5 - 15    Comment: Performed at [facility], [address], [place], [place] [address]  Ethanol     Status: None   Collection Time: 01/21/21  2:17 PM  Result Value Ref Range   Alcohol, Ethyl (B) <10 <10 mg/dL    Comment: (NOTE) Lowest detectable limit for serum alcohol is 10 mg/dL.  For medical purposes only. Performed at [facility], [address], [place], [place] [address]   CBC with Diff     Status: None   Collection Time: 01/21/21  2:17 PM  Result Value Ref Range   WBC 6.1 4.0 - 10.5 K/uL   RBC 5.07 4.22 - 5.81 MIL/uL   Hemoglobin 14.4 13.0 - 17.0 g/dL   HCT 29.5 62.1 - 30.8 %   MCV 88.2 80.0 - 100.0 fL   MCH 28.4 26.0 - 34.0 pg   MCHC 32.2 30.0 - 36.0 g/dL   RDW 65.7 84.6 - 96.2 %   Platelets 321 150 - 400 K/uL   nRBC 0.0 0.0 - 0.2 %   Neutrophils Relative % 73 %   Neutro Abs 4.5 1.7 - 7.7 K/uL   Lymphocytes Relative 19 %   Lymphs Abs 1.2 0.7 - 4.0 K/uL   Monocytes Relative 6 %   Monocytes Absolute 0.4 0.1 - 1.0 K/uL   Eosinophils Relative 1 %   Eosinophils Absolute 0.1 0.0 - 0.5 K/uL   Basophils Relative 0 %   Basophils Absolute 0.0 0.0 - 0.1 K/uL   Immature Granulocytes 1 %   Abs Immature Granulocytes 0.03 0.00 - 0.07 K/uL    Comment: Performed at [facility], [address] [place], [place] [address]  Salicylate level     Status: Abnormal   Collection Time: 01/21/21  2:17 PM  Result Value Ref Range   Salicylate Lvl <7.0 (L) 7.0 - 30.0 mg/dL    Comment: Performed at [facility],  [address], [place], [place] [address]  Acetaminophen level     Status: Abnormal   Collection Time: 01/21/21  2:17 PM  Result Value Ref Range   Acetaminophen (Tylenol), Serum <10 (L) 10 - 30 ug/mL    Comment: (NOTE) Therapeutic concentrations vary significantly. A range of 10-30 ug/mL  may be an effective concentration for many patients. However, some  are best treated at concentrations  outside of this range. Acetaminophen concentrations >150 ug/mL at 4 hours after ingestion  and >50 ug/mL at 12 hours after ingestion are often associated with  toxic reactions.  Performed at [facility], [address] [place], [place] [address]   Troponin I (High Sensitivity)     Status: None   Collection Time: 01/21/21  2:17 PM  Result Value Ref Range   Troponin I (High Sensitivity) <2 <18 ng/L    Comment: (NOTE) Elevated high sensitivity troponin I (hsTnI) values and significant  changes across serial measurements may suggest ACS but many other  chronic and acute conditions are known to elevate hsTnI results.  Refer to the "Links" section for chest pain algorithms and additional  guidance. Performed at [facility], [address], [place], [place] [address]   Troponin I (High Sensitivity)     Status: None   Collection Time: 01/21/21  4:18 PM  Result Value Ref Range   Troponin I (High Sensitivity) 4 <18 ng/L    Comment: (NOTE) Elevated high sensitivity troponin I (hsTnI) values and significant  changes across serial measurements may suggest ACS but many other  chronic and acute conditions are known to elevate hsTnI results.  Refer to the "Links" section for chest pain algorithms and additional  guidance. Performed at [facility], [address], [place], [place] [address]   Resp Panel by RT-PCR (Flu A&B, Covid) Nasopharyngeal Swab     Status: None   Collection Time: 01/21/21  7:23 PM   Specimen: Nasopharyngeal Swab; Nasopharyngeal(NP) swabs in vial transport medium  Result Value Ref Range   SARS Coronavirus 2 by RT PCR NEGATIVE NEGATIVE    Comment: (NOTE) SARS-CoV-2 target nucleic acids are NOT DETECTED.  The SARS-CoV-2 RNA is generally detectable in upper respiratory specimens during the acute phase of infection. The lowest concentration of SARS-CoV-2 viral copies this assay can detect is 138 copies/mL. A negative result does not preclude  SARS-Cov-2 infection and should not be used as the sole basis for treatment or other patient management decisions. A negative result may occur with  improper specimen collection/handling, submission of specimen other than nasopharyngeal swab, presence of viral mutation(s) within the areas targeted by this assay, and inadequate number of viral copies(<138 copies/mL). A negative result must be combined with clinical observations, patient history, and epidemiological information. The expected result is Negative.  Fact Sheet for Patients:  BloggerCourse.com  Fact Sheet for Healthcare Providers:  SeriousBroker.it  This test is no t yet approved or cleared by the [place] FDA and  has been authorized for detection and/or diagnosis of SARS-CoV-2 by FDA under an Emergency Use Authorization (EUA). This EUA will remain  in effect (meaning this test can be used) for the duration of the COVID-19 declaration under Section 564(b)(1) of the Act, 21 U.S.C.section 360bbb-3(b)(1), unless the authorization is terminated  or revoked sooner.       Influenza A by PCR NEGATIVE NEGATIVE   Influenza B by PCR NEGATIVE NEGATIVE    Comment: (NOTE) The Xpert Xpress SARS-CoV-2/FLU/RSV plus assay  is intended as an aid in the diagnosis of influenza from Nasopharyngeal swab specimens and should not be used as a sole basis for treatment. Nasal washings and aspirates are unacceptable for Xpert Xpress SARS-CoV-2/FLU/RSV testing.  Fact Sheet for Patients: BloggerCourse.com  Fact Sheet for Healthcare Providers: SeriousBroker.it  This test is not yet approved or cleared by the [place] FDA and has been authorized for detection and/or diagnosis of SARS-CoV-2 by FDA under an Emergency Use Authorization (EUA). This EUA will remain in effect (meaning this test can be used) for the duration of the COVID-19  declaration under Section 564(b)(1) of the Act, 21 U.S.C. section 360bbb-3(b)(1), unless the authorization is terminated or revoked.  Performed at [facility], [address], [place], [place] [address]   Urine rapid drug screen (hosp performed)     Status: None   Collection Time: 01/22/21  2:34 AM  Result Value Ref Range   Opiates NONE DETECTED NONE DETECTED   Cocaine NONE DETECTED NONE DETECTED   Benzodiazepines NONE DETECTED NONE DETECTED   Amphetamines NONE DETECTED NONE DETECTED   Tetrahydrocannabinol NONE DETECTED NONE DETECTED   Barbiturates NONE DETECTED NONE DETECTED    Comment: (NOTE) DRUG SCREEN FOR MEDICAL PURPOSES ONLY.  IF CONFIRMATION IS NEEDED FOR ANY PURPOSE, NOTIFY LAB WITHIN 5 DAYS.  LOWEST DETECTABLE LIMITS FOR URINE DRUG SCREEN Drug Class                     Cutoff (ng/mL) Amphetamine and metabolites    1000 Barbiturate and metabolites    200 Benzodiazepine                 200 Tricyclics and metabolites     300 Opiates and metabolites        300 Cocaine and metabolites        300 THC                            50 Performed at [facility], [address], [place], [place] [address]     Medications:  Current Facility-Administered Medications  Medication Dose Route Frequency Provider Last Rate Last Admin   amitriptyline (ELAVIL) tablet 75 mg  75 mg Oral TID Tegeler, Canary Brim, MD   75 mg at 01/22/21 1119   clonazePAM (KLONOPIN) tablet 1 mg  1 mg Oral TID Tegeler, Canary Brim, MD   1 mg at 01/22/21 1119   oxyCODONE (Oxy IR/ROXICODONE) immediate release tablet 10 mg  10 mg Oral QID PRN Tegeler, Canary Brim, MD   10 mg at 01/22/21 0415   Current Outpatient Medications  Medication Sig Dispense Refill   amitriptyline (ELAVIL) 75 MG tablet Take 75 mg by mouth 3 (three) times daily.     Buprenorphine HCl-Naloxone HCl 8-2 MG FILM Place under the tongue 2 (two) times daily.     clonazePAM (KLONOPIN) 1 MG tablet Take 1 mg by mouth 3  (three) times daily.     Eszopiclone 3 MG TABS Take 3 mg by mouth at bedtime. Take immediately before bedtime     folic acid (FOLVITE) 1 MG tablet Take 1 mg by mouth daily.     furosemide (LASIX) 40 MG tablet Take 40 mg by mouth 2 (two) times daily as needed for fluid or edema.     Oxycodone HCl 10 MG TABS Take 10 mg by mouth 4 (four) times daily as needed (pain).  potassium chloride SA (KLOR-CON) 20 MEQ tablet Take 20 mEq by mouth 2 (two) times daily as needed (low potassium).     Vitamin D, Ergocalciferol, (DRISDOL) 1.25 MG (50000 UNIT) CAPS capsule Take 50,000 Units by mouth every 7 (seven) days.     albuterol (PROVENTIL HFA;VENTOLIN HFA) 108 (90 BASE) MCG/ACT inhaler Inhale 1-2 puffs into the lungs every 6 (six) hours as needed for wheezing or shortness of breath. (Patient not taking: No sig reported) 1 Inhaler 0   ALPRAZolam (XANAX) 1 MG tablet Take 1 tablet (1 mg total) by mouth 3 (three) times daily as needed for anxiety. (Patient not taking: No sig reported) 90 tablet 5   gabapentin (NEURONTIN) 300 MG capsule Take 1 capsule (300 mg total) by mouth at bedtime. (Patient not taking: No sig reported) 30 capsule 0   methocarbamol (ROBAXIN) 500 MG tablet Take 1 tablet (500 mg total) by mouth every 6 (six) hours as needed for muscle spasms. (Patient not taking: No sig reported) 40 tablet 0   zolpidem (AMBIEN CR) 12.5 MG CR tablet Take 1 tablet (12.5 mg total) by mouth at bedtime. sleep (Patient not taking: No sig reported) 30 tablet 5    Musculoskeletal: Strength & Muscle Tone: within normal limits Gait & Station: normal Patient leans: [name]   Psychiatric Specialty Exam:  Presentation  General Appearance: Appropriate for Environment  Eye Contact:Good  Speech:Clear and Coherent  Speech Volume:Normal  Handedness:Right   Mood and Affect  Mood:Anxious  Affect:Congruent; Restricted   Thought Process  Thought Processes:Coherent; Goal Directed  Descriptions of  Associations:Circumstantial  Orientation:Full (Time, Place and Person)  Thought Content:Logical  History of Schizophrenia/Schizoaffective disorder:No  Duration of Psychotic Symptoms:No data recorded Hallucinations:No data recorded Ideas of Reference:None  Suicidal Thoughts:Suicidal Thoughts: No  Homicidal Thoughts:Homicidal Thoughts: No   Sensorium  Memory:Immediate Good; Recent Good; Remote Good  Judgment:Fair  Insight:Fair   Executive Functions  Concentration:Good  Attention Span:Good  Recall:Good  Fund of Knowledge:Good  Language:Good   Psychomotor Activity  Psychomotor Activity:Psychomotor Activity: Normal   Assets  Assets:Communication Skills; Financial Resources/Insurance; Housing   Sleep  Sleep:Sleep: Fair Number of Hours of Sleep: 5    Physical Exam: Physical Exam Constitutional:      Appearance: Normal appearance.  HENT:     Head: Normocephalic.  Cardiovascular:     Rate and Rhythm: Normal rate.     Pulses: Normal pulses.  Pulmonary:     Effort: Pulmonary effort is normal.  Musculoskeletal:     Cervical back: Normal range of motion.  Neurological:     General: No focal deficit present.     Mental Status: He is alert and oriented to person, place, and time.  Psychiatric:        Thought Content: Thought content normal.        Judgment: Judgment normal.   Review of Systems  Constitutional: Negative.   HENT: Negative.    Eyes: Negative.   Respiratory: Negative.    Cardiovascular: Negative.   Gastrointestinal: Negative.   Genitourinary: Negative.   Musculoskeletal: Negative.   Skin: Negative.   Neurological: Negative.   Endo/Heme/Allergies: Negative.   Psychiatric/Behavioral:  Negative for hallucinations, memory loss, substance abuse and suicidal ideas. The patient is nervous/anxious and has insomnia.   Blood pressure 140/87, pulse 99, temperature 99.1 F (37.3 C), resp. rate 18, SpO2 94 %. There is no height or weight on file  to calculate BMI.  Treatment Plan Summary: Plan- As per above assessment, there are no current grounds for  involuntary commitment at this time. Patient sleep remains fair, but he no longer endorses safety concerns.  He is future oriented, and relates his plan to continue with outpatient follow-up with his pain mgmt provider.   Patient has chronic pain and followed by Dr. Idelia Salm [facility]win in [facility].  He is out of clonazepam and is anxious to discharge home without this medication.  TOC-SW to assist with the following:   -Secure 24-48 hour follow-up appointment with Dr. Thompson Caul for medication management. Provider may be able to call meds to pharmacy if no appt available within relevant time frame.     -Additionally, please link him with mental health resources, therapy and medication mgmt.  I have discussed the above with the patient and he agrees to this.    Disposition: No evidence of imminent risk to self or others at present.   Patient does not meet criteria for psychiatric inpatient admission. Supportive therapy provided about ongoing stressors. Discussed crisis plan, support from social network, calling 911, coming to the Emergency Department, and calling Suicide Hotline.  This service was provided via telemedicine using a 2-way, interactive audio and video technology.  Names of all persons participating in this telemedicine service and their role in this encounter. Name: Davidlee Orozco Role: Patient  Name: Ophelia Shoulder Role: PMHNP    Chales Abrahams, NP 01/22/2021 1:43 PM
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
## 2021-12-23 ENCOUNTER — Ambulatory Visit (INDEPENDENT_AMBULATORY_CARE_PROVIDER_SITE_OTHER): Payer: Medicare Other | Admitting: Orthopaedic Surgery

## 2021-12-23 DIAGNOSIS — M25552 Pain in left hip: Secondary | ICD-10-CM | POA: Diagnosis not present

## 2021-12-23 DIAGNOSIS — Z96643 Presence of artificial hip joint, bilateral: Secondary | ICD-10-CM

## 2021-12-23 MED ORDER — OXYCODONE-ACETAMINOPHEN 5-325 MG PO TABS: 1.0000 | ORAL_TABLET | Freq: Four times a day (QID) | ORAL | 0 refills | Status: DC | PRN

## 2021-12-23 NOTE — Progress Notes (Signed)
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  He is on chronic oxycodone extended release twice daily which are 12-hour extended release pills that he gets through a pain clinic.  He fell off a horse last Thursday and his left hip has been hurting since then.  He was seen in the emergency room in [place] and x-rays were negative for any type of fracture and the components knee replacements look good.  I have reviewed those x-rays there is no evidence of fracture.  He is not walking with an assistive device.  The ER gave him Percocet.  He is requesting at least a few more days of a narcotic given that he is hurting.  I can easily move both hips without difficulty and he is walking without any significant difficulty.  There is some slight pain in the groin on the left side.  The x-rays look normal of both hip replacements and there is no evidence of fracture or complicating features the hardware.  I told him I would only send him in 5 days worth of oxycodone to take sparingly and I would not refill these.  Hopefully this will not violate his pain contract.  Follow-up is as needed.

## 2023-03-09 ENCOUNTER — Other Ambulatory Visit: Payer: Self-pay

## 2023-03-09 ENCOUNTER — Encounter: Payer: Self-pay | Admitting: Physician Assistant

## 2023-03-09 ENCOUNTER — Ambulatory Visit (INDEPENDENT_AMBULATORY_CARE_PROVIDER_SITE_OTHER): Payer: Medicare Other | Admitting: Physician Assistant

## 2023-03-09 VITALS — Ht 71.0 in | Wt 225.0 lb

## 2023-03-09 DIAGNOSIS — M25562 Pain in left knee: Secondary | ICD-10-CM | POA: Diagnosis not present

## 2023-03-09 MED ORDER — LIDOCAINE HCL 1 % IJ SOLN
3.0000 mL | INTRAMUSCULAR | Status: AC | PRN
  Administered 2023-03-09: 3 mL

## 2023-03-09 MED ORDER — METHYLPREDNISOLONE ACETATE 40 MG/ML IJ SUSP
40.0000 mg | INTRAMUSCULAR | Status: AC | PRN
  Administered 2023-03-09: 40 mg via INTRA_ARTICULAR

## 2023-03-09 NOTE — Progress Notes (Signed)
HPI: [name] comes in today for left knee pain for the last 3 to 4 weeks.  Pain is mostly the lateral aspect of the knee.  Describes intense pain.  He is having some giving way but denies any catching locking or painful popping.  No acute injury.  He has had no treatment for this knee pain.  Review of systems: Denies fevers chills.  Please see HPI otherwise negative or noncontributory.  Physical exam: Height 5 foot 11 weight 225  General: Well-developed well-nourished male no acute distress mood and affect appropriate.  Ambulates without any assist device.  Able to get on and off the exam table.  Bilateral knees: Good range of motion no abnormal warmth erythema of either knee.  Left knee slight edema no ecchymosis.  Calves are supple and nontender bilaterally.  Good range of motion of both knees.  Patellofemoral crepitus bilaterally.  No instability valgus varus stressing of either knee.  Tenderness lateral joint line of the left knee.  Radiographs: Left knee : AP and lateral joint  no acute fracture . Knee well preserved. Knee located.    Impression: Acute left knee pain  Plan: Recommend cortisone injection and he is agreeable.  Follow-up with [place] in 2 weeks if pain persist becomes worse or he continues mechanical symptoms that would recommend MRI to rule out meniscal tear.  Questions were encouraged and answered at length.  He was instructed on how to do quad strengthening exercises.  -  Procedure Note  Patient: [name]             Date of Birth: 1965-11-07           MRN: [id number]             Visit Date: 03/09/2023  Procedures: Visit Diagnoses:  1. Acute pain of left knee     Large Joint Inj: L knee on 03/09/2023 5:03 PM Indications: pain Details: 22 G 1.5 in needle, anterolateral approach  Arthrogram: No  Medications: 3 mL lidocaine 1 %; 40 mg methylPREDNISolone acetate 40 MG/ML Outcome: tolerated well, no immediate complications Procedure, treatment alternatives,  risks and benefits explained, specific risks discussed. Consent was given by the patient. Immediately prior to procedure a time out was called to verify the correct patient, procedure, equipment, support staff and site/side marked as required. Patient was prepped and draped in the usual sterile fashion.

## 2023-03-23 ENCOUNTER — Ambulatory Visit: Payer: Medicare Other | Admitting: Physician Assistant
